# Patient Record
Sex: Female | Born: 1945
Health system: Southern US, Community
[De-identification: ages and names within clinical notes are randomized; demographics above are authoritative.]

## PROBLEM LIST (undated history)

## (undated) DIAGNOSIS — M1990 Unspecified osteoarthritis, unspecified site: Secondary | ICD-10-CM

## (undated) DIAGNOSIS — K219 Gastro-esophageal reflux disease without esophagitis: Secondary | ICD-10-CM

## (undated) DIAGNOSIS — C801 Malignant (primary) neoplasm, unspecified: Secondary | ICD-10-CM

## (undated) DIAGNOSIS — E785 Hyperlipidemia, unspecified: Secondary | ICD-10-CM

## (undated) DIAGNOSIS — K5792 Diverticulitis of intestine, part unspecified, without perforation or abscess without bleeding: Secondary | ICD-10-CM

## (undated) DIAGNOSIS — M8580 Other specified disorders of bone density and structure, unspecified site: Secondary | ICD-10-CM

## (undated) DIAGNOSIS — D649 Anemia, unspecified: Secondary | ICD-10-CM

## (undated) DIAGNOSIS — H9190 Unspecified hearing loss, unspecified ear: Secondary | ICD-10-CM

## (undated) DIAGNOSIS — IMO0001 Reserved for inherently not codable concepts without codable children: Secondary | ICD-10-CM

## (undated) HISTORY — PX: HEMORROIDECTOMY: SUR656

## (undated) HISTORY — DX: Hyperlipidemia, unspecified: E78.5

## (undated) HISTORY — PX: ABDOMINAL HYSTERECTOMY: SHX81

## (undated) HISTORY — PX: OTHER SURGICAL HISTORY: SHX169

## (undated) HISTORY — DX: Gastro-esophageal reflux disease without esophagitis: K21.9

---

## 1974-09-08 HISTORY — PX: MYOMECTOMY: SHX85

## 2006-04-02 ENCOUNTER — Ambulatory Visit: Payer: Self-pay | Admitting: Cardiology

## 2006-04-21 ENCOUNTER — Ambulatory Visit: Payer: Self-pay | Admitting: Cardiology

## 2014-04-02 ENCOUNTER — Emergency Department (HOSPITAL_COMMUNITY): Payer: Medicare FFS

## 2014-04-02 ENCOUNTER — Emergency Department (HOSPITAL_COMMUNITY)
Admission: EM | Admit: 2014-04-02 | Discharge: 2014-04-03 | Disposition: A | Discharge status: Home / Self Care | Payer: Medicare FFS | Attending: Emergency Medicine | Admitting: Emergency Medicine

## 2014-04-02 ENCOUNTER — Encounter (HOSPITAL_COMMUNITY): Payer: Self-pay | Admitting: Emergency Medicine

## 2014-04-02 DIAGNOSIS — Z79899 Other long term (current) drug therapy: Secondary | ICD-10-CM | POA: Insufficient documentation

## 2014-04-02 DIAGNOSIS — R109 Unspecified abdominal pain: Secondary | ICD-10-CM | POA: Diagnosis present

## 2014-04-02 DIAGNOSIS — K219 Gastro-esophageal reflux disease without esophagitis: Secondary | ICD-10-CM | POA: Insufficient documentation

## 2014-04-02 DIAGNOSIS — R112 Nausea with vomiting, unspecified: Secondary | ICD-10-CM | POA: Diagnosis present

## 2014-04-02 HISTORY — DX: Diverticulitis of intestine, part unspecified, without perforation or abscess without bleeding: K57.92

## 2014-04-02 HISTORY — DX: Gastro-esophageal reflux disease without esophagitis: K21.9

## 2014-04-02 LAB — COMPREHENSIVE METABOLIC PANEL
ALBUMIN: 3.5 g/dL (ref 3.5–5.2)
ALK PHOS: 71 U/L (ref 39–117)
ALT: 21 U/L (ref 0–35)
ANION GAP: 12 (ref 5–15)
AST: 15 U/L (ref 0–37)
BUN: 22 mg/dL (ref 6–23)
CO2: 24 mEq/L (ref 19–32)
Calcium: 8.9 mg/dL (ref 8.4–10.5)
Chloride: 103 mEq/L (ref 96–112)
Creatinine, Ser: 0.63 mg/dL (ref 0.50–1.10)
GFR calc Af Amer: >90 mL/min (ref >90)
GFR calc non Af Amer: 89 mL/min — ABNORMAL LOW (ref >90)
Glucose, Bld: 142 mg/dL — ABNORMAL HIGH (ref 70–99)
Potassium: 3.7 mEq/L (ref 3.7–5.3)
Sodium: 139 mEq/L (ref 137–147)
TOTAL PROTEIN: 6.5 g/dL (ref 6.0–8.3)
Total Bilirubin: 0.2 mg/dL — ABNORMAL LOW (ref 0.3–1.2)

## 2014-04-02 LAB — URINALYSIS, ROUTINE W REFLEX MICROSCOPIC
BILIRUBIN URINE: NEGATIVE
Glucose, UA: NEGATIVE mg/dL
Hgb urine dipstick: NEGATIVE
Ketones, ur: NEGATIVE mg/dL
Leukocytes, UA: NEGATIVE
NITRITE: NEGATIVE
PH: 6 (ref 5.0–8.0)
PROTEIN: NEGATIVE mg/dL
Specific Gravity, Urine: 1.02 (ref 1.005–1.030)
Urobilinogen, UA: 0.2 mg/dL (ref 0.0–1.0)

## 2014-04-02 LAB — CBC WITH DIFFERENTIAL/PLATELET
BASOS PCT: 0 % (ref 0–1)
Basophils Absolute: 0 10*3/uL (ref 0.0–0.1)
EOS ABS: 0.1 10*3/uL (ref 0.0–0.7)
Eosinophils Relative: 1 % (ref 0–5)
HCT: 35.6 % — ABNORMAL LOW (ref 36.0–46.0)
Hemoglobin: 12 g/dL (ref 12.0–15.0)
Lymphocytes Relative: 6 % — ABNORMAL LOW (ref 12–46)
Lymphs Abs: 0.7 10*3/uL (ref 0.7–4.0)
MCH: 27.5 pg (ref 26.0–34.0)
MCHC: 33.7 g/dL (ref 30.0–36.0)
MCV: 81.5 fL (ref 78.0–100.0)
Monocytes Absolute: 0.5 10*3/uL (ref 0.1–1.0)
Monocytes Relative: 4 % (ref 3–12)
Neutro Abs: 10.1 10*3/uL — ABNORMAL HIGH (ref 1.7–7.7)
Neutrophils Relative %: 89 % — ABNORMAL HIGH (ref 43–77)
PLATELETS: 330 10*3/uL (ref 150–400)
RBC: 4.37 MIL/uL (ref 3.87–5.11)
RDW: 14.4 % (ref 11.5–15.5)
WBC: 11.5 10*3/uL — ABNORMAL HIGH (ref 4.0–10.5)

## 2014-04-02 LAB — LACTIC ACID, PLASMA: Lactic Acid, Venous: 2.7 mmol/L — ABNORMAL HIGH (ref 0.5–2.2)

## 2014-04-02 LAB — LIPASE, BLOOD: LIPASE: 27 U/L (ref 11–59)

## 2014-04-02 MED ORDER — IOHEXOL 300 MG/ML  SOLN
100.0000 mL | Freq: Once | INTRAMUSCULAR | Status: AC | PRN
  Administered 2014-04-02: 100 mL via INTRAVENOUS

## 2014-04-02 MED ORDER — ONDANSETRON HCL 4 MG/2ML IJ SOLN
4.0000 mg | INTRAMUSCULAR | Status: DC | PRN
Start: 2014-04-02 — End: 2014-04-03
  Administered 2014-04-02: 4 mg via INTRAVENOUS
  Filled 2014-04-02: qty 2

## 2014-04-02 MED ORDER — IOHEXOL 300 MG/ML  SOLN
50.0000 mL | Freq: Once | INTRAMUSCULAR | Status: AC | PRN
  Administered 2014-04-02: 50 mL via ORAL

## 2014-04-02 MED ORDER — MORPHINE SULFATE 4 MG/ML IJ SOLN
4.0000 mg | INTRAMUSCULAR | Status: DC | PRN
  Administered 2014-04-02: 4 mg via INTRAVENOUS
  Filled 2014-04-02: qty 1

## 2014-04-02 MED ORDER — SODIUM CHLORIDE 0.9 % IV SOLN
INTRAVENOUS | Status: DC
  Administered 2014-04-02: 1000 mL via INTRAVENOUS

## 2014-04-02 NOTE — ED Notes (Signed)
Patient via [facility] c/o abdominal pain X6 Hours. Patient states nausea and vomiting X2. Patient also states she feels like she is constipated, patient states she has a loose bowel movement at 2300 yesterday. A&OX4. Patient c/o pain to the RUQ RLQ

## 2014-04-02 NOTE — ED Notes (Signed)
Patient ambulatory to restroom at this time. Steady gait no distress noted.

## 2014-04-02 NOTE — ED Notes (Signed)
Pt vomited while drinking contrast. Pt gown & linens changed. Pt instructed to take only sips to avoid vomiting.

## 2014-04-02 NOTE — ED Provider Notes (Signed)
CSN: [id number]     Arrival date & time 04/02/14  1930 History   First MD Initiated Contact with Patient 04/02/14 1937     Chief Complaint  Patient presents with  . Abdominal Pain     HPI Pt was seen at [place].  Per pt, c/o gradual onset and persistence of constant generalized abd "pain" since noon today.  Has been associated with multiple intermittent episodes of N/V.  Describes the abd pain as "dull" and "aching."  Last BM yesterday was "normal" per pt. Denies diarrhea, no fevers, no back pain, no rash, no CP/SOB, no black or blood in stools or emesis.       Past Medical History  Diagnosis Date  . Diverticulitis   . Acid reflux disease    Past Surgical History  Procedure Laterality Date  . Abdominal hysterectomy      History  Substance Use Topics  . Smoking status: Never Smoker   . Smokeless tobacco: Not on file  . Alcohol Use: 0.6 oz/week    1 Glasses of wine per week    Review of Systems ROS: Statement: All systems negative except as marked or noted in the HPI; Constitutional: Negative for fever and chills. ; ; Eyes: Negative for eye pain, redness and discharge. ; ; ENMT: Negative for ear pain, hoarseness, nasal congestion, sinus pressure and sore throat. ; ; Cardiovascular: Negative for chest pain, palpitations, diaphoresis, dyspnea and peripheral edema. ; ; Respiratory: Negative for cough, wheezing and stridor. ; ; Gastrointestinal: +abd pain, N/V. Negative for diarrhea, blood in stool, hematemesis, jaundice and rectal bleeding. . ; ; Genitourinary: Negative for dysuria, flank pain and hematuria. ; ; Musculoskeletal: Negative for back pain and neck pain. Negative for swelling and trauma.; ; Skin: Negative for pruritus, rash, abrasions, blisters, bruising and skin lesion.; ; Neuro: Negative for headache, lightheadedness and neck stiffness. Negative for weakness, altered level of consciousness , altered mental status, extremity weakness, paresthesias, involuntary movement, seizure  and syncope.      Allergies  Codeine  Home Medications   Prior to Admission medications   Medication Sig Start Date End Date Taking? Authorizing Provider  Acetaminophen-Caffeine (EXCEDRIN TENSION HEADACHE) 500-65 MG TABS Take 1-2 tablets by mouth daily as needed (for pain).   Yes Historical Provider, MD  Lactobacillus (ACIDOPHILUS PO) Take 2-3 capsules by mouth 2 (two) times daily. Three capsules in the morning and two capsules at bedtime   Yes Historical Provider, MD  Multiple Vitamin (MULTIVITAMIN WITH MINERALS) TABS tablet Take 1 tablet by mouth daily.   Yes Historical Provider, MD  omeprazole (PRILOSEC OTC) 20 MG tablet Take 20 mg by mouth daily.   Yes Historical Provider, MD   BP 155/83  Pulse 116  Temp(Src) 99.6 F (37.6 C) (Oral)  Resp 18  Ht 5\' 9"  (1.753 m)  Wt 165 lb (74.844 kg)  BMI 24.36 kg/m2  SpO2 95% Physical Exam 1950: Physical examination:  Nursing notes reviewed; Vital signs and O2 SAT reviewed;  Constitutional: Well developed, Well nourished, Well hydrated, Uncomfortable appearing.;; Head:  Normocephalic, atraumatic; Eyes: EOMI, PERRL, No scleral icterus; ENMT: Mouth and pharynx normal, Mucous membranes moist; Neck: Supple, Full range of motion, No lymphadenopathy; Cardiovascular: Regular rate and rhythm, No gallop; Respiratory: Breath sounds clear & equal bilaterally, No wheezes.  Speaking full sentences with ease, Normal respiratory effort/excursion; Chest: Nontender, Movement normal; Abdomen: Soft, +diffuse tenderness to palp, esp LLQ. No rebound or guarding. Nondistended, Normal bowel sounds; Genitourinary: No CVA tenderness; Extremities: Pulses normal, No  tenderness, No edema, No calf edema or asymmetry.; Neuro: AA&Ox3, Major CN grossly intact.  Speech clear. No gross focal motor or sensory deficits in extremities.; Skin: Color normal, Warm, Dry.   ED Course  Procedures     MDM  MDM Reviewed: previous chart, nursing note and vitals Interpretation:  labs   Results for orders placed during the hospital encounter of 04/02/14  URINALYSIS, ROUTINE W REFLEX MICROSCOPIC      Result Value Ref Range   Color, Urine YELLOW  YELLOW   APPearance CLEAR  CLEAR   Specific Gravity, Urine 1.020  1.005 - 1.030   pH 6.0  5.0 - 8.0   Glucose, UA NEGATIVE  NEGATIVE mg/dL   Hgb urine dipstick NEGATIVE  NEGATIVE   Bilirubin Urine NEGATIVE  NEGATIVE   Ketones, ur NEGATIVE  NEGATIVE mg/dL   Protein, ur NEGATIVE  NEGATIVE mg/dL   Urobilinogen, UA 0.2  0.0 - 1.0 mg/dL   Nitrite NEGATIVE  NEGATIVE   Leukocytes, UA NEGATIVE  NEGATIVE  CBC WITH DIFFERENTIAL      Result Value Ref Range   WBC 11.5 (*) 4.0 - 10.5 K/uL   RBC 4.37  3.87 - 5.11 MIL/uL   Hemoglobin 12.0  12.0 - 15.0 g/dL   HCT 35.6 (*) 36.0 - 46.0 %   MCV 81.5  78.0 - 100.0 fL   MCH 27.5  26.0 - 34.0 pg   MCHC 33.7  30.0 - 36.0 g/dL   RDW 14.4  11.5 - 15.5 %   Platelets 330  150 - 400 K/uL   Neutrophils Relative % 89 (*) 43 - 77 %   Neutro Abs 10.1 (*) 1.7 - 7.7 K/uL   Lymphocytes Relative 6 (*) 12 - 46 %   Lymphs Abs 0.7  0.7 - 4.0 K/uL   Monocytes Relative 4  3 - 12 %   Monocytes Absolute 0.5  0.1 - 1.0 K/uL   Eosinophils Relative 1  0 - 5 %   Eosinophils Absolute 0.1  0.0 - 0.7 K/uL   Basophils Relative 0  0 - 1 %   Basophils Absolute 0.0  0.0 - 0.1 K/uL  COMPREHENSIVE METABOLIC PANEL      Result Value Ref Range   Sodium 139  137 - 147 mEq/L   Potassium 3.7  3.7 - 5.3 mEq/L   Chloride 103  96 - 112 mEq/L   CO2 24  19 - 32 mEq/L   Glucose, Bld 142 (*) 70 - 99 mg/dL   BUN 22  6 - 23 mg/dL   Creatinine, Ser 0.63  0.50 - 1.10 mg/dL   Calcium 8.9  8.4 - 10.5 mg/dL   Total Protein 6.5  6.0 - 8.3 g/dL   Albumin 3.5  3.5 - 5.2 g/dL   AST 15  0 - 37 U/L   ALT 21  0 - 35 U/L   Alkaline Phosphatase 71  39 - 117 U/L   Total Bilirubin 0.2 (*) 0.3 - 1.2 mg/dL   GFR calc non Af Amer 89 (*) >90 mL/min   GFR calc Af Amer >90  >90 mL/min   Anion gap 12  5 - 15  LIPASE, BLOOD      Result  Value Ref Range   Lipase 27  11 - 59 U/L  LACTIC ACID, PLASMA      Result Value Ref Range   Lactic Acid, Venous 2.7 (*) 0.5 - 2.2 mmol/L     2215:  Pt has vomited while in  the ED. IV zofran given. CT scan pending. Dispo based on results. Sign out to Dr. Lacinda Axon.    Alfonzo Feller, DO 04/02/14 2221

## 2014-04-03 DIAGNOSIS — R112 Nausea with vomiting, unspecified: Secondary | ICD-10-CM | POA: Diagnosis present

## 2014-04-03 MED ORDER — ONDANSETRON HCL 8 MG PO TABS: 8.0000 mg | ORAL_TABLET | ORAL | Status: DC | PRN

## 2014-04-03 NOTE — ED Notes (Signed)
Patient verbalizes understanding of discharge instructions, medications, and follow up care. Patient ambulatory out of department at this time.

## 2014-04-03 NOTE — Discharge Instructions (Signed)
CT scan showed no acute findings. Medication for nausea. Followup your primary care Dr.

## 2014-04-03 NOTE — ED Provider Notes (Signed)
CT scan abdomen and pelvis showed no acute findings. No acute abdomen at discharge. Patient feels better. Discharge medications Zofran 8 mg  Nat Christen, MD 04/03/14 0010

## 2014-04-04 LAB — URINE CULTURE

## 2015-01-23 NOTE — Patient Instructions (Addendum)
Your procedure is scheduled on: 02/01/2015  Report to [facility] at 1220  PM.  Call this number if you have problems the morning of surgery: [phone]   Do not eat food or drink liquids :After Midnight.      Take these medicines the morning of surgery with A SIP OF WATER: prilosec, zofran   Do not wear jewelry, make-up or nail polish.  Do not wear lotions, powders, or perfumes.   Do not shave 48 hours prior to surgery.  Do not bring valuables to the hospital.  Contacts, dentures or bridgework may not be worn into surgery.  Leave suitcase in the car. After surgery it may be brought to your room.  For patients admitted to the hospital, checkout time is 11:00 AM the day of discharge.   Patients discharged the day of surgery will not be allowed to drive home.  :     Please read over the following fact sheets that you were given: Coughing and Deep Breathing, Surgical Site Infection Prevention, Anesthesia Post-op Instructions and Care and Recovery After Surgery    Cataract A cataract is a clouding of the lens of the eye. When a lens becomes cloudy, vision is reduced based on the degree and nature of the clouding. Many cataracts reduce vision to some degree. Some cataracts make people more near-sighted as they develop. Other cataracts increase glare. Cataracts that are ignored and become worse can sometimes look white. The white color can be seen through the pupil. CAUSES   Aging. However, cataracts may occur at any age, even in newborns.   Certain drugs.   Trauma to the eye.   Certain diseases such as diabetes.   Specific eye diseases such as chronic inflammation inside the eye or a sudden attack of a rare form of glaucoma.   Inherited or acquired medical problems.  SYMPTOMS   Gradual, progressive drop in vision in the affected eye.   Severe, rapid visual loss. This most often happens when trauma is the cause.  DIAGNOSIS  To detect a cataract, an eye doctor examines the lens.  Cataracts are best diagnosed with an exam of the eyes with the pupils enlarged (dilated) by drops.  TREATMENT  For an early cataract, vision may improve by using different eyeglasses or stronger lighting. If that does not help your vision, surgery is the only effective treatment. A cataract needs to be surgically removed when vision loss interferes with your everyday activities, such as driving, reading, or watching TV. A cataract may also have to be removed if it prevents examination or treatment of another eye problem. Surgery removes the cloudy lens and usually replaces it with a substitute lens (intraocular lens, IOL).  At a time when both you and your doctor agree, the cataract will be surgically removed. If you have cataracts in both eyes, only one is usually removed at a time. This allows the operated eye to heal and be out of danger from any possible problems after surgery (such as infection or poor wound healing). In rare cases, a cataract may be doing damage to your eye. In these cases, your caregiver may advise surgical removal right away. The vast majority of people who have cataract surgery have better vision afterward. HOME CARE INSTRUCTIONS  If you are not planning surgery, you may be asked to do the following:  Use different eyeglasses.   Use stronger or brighter lighting.   Ask your eye doctor about reducing your medicine dose or changing medicines if  it is thought that a medicine caused your cataract. Changing medicines does not make the cataract go away on its own.   Become familiar with your surroundings. Poor vision can lead to injury. Avoid bumping into things on the affected side. You are at a higher risk for tripping or falling.   Exercise extreme care when driving or operating machinery.   Wear sunglasses if you are sensitive to bright light or experiencing problems with glare.  SEEK IMMEDIATE MEDICAL CARE IF:   You have a worsening or sudden vision loss.   You notice  redness, swelling, or increasing pain in the eye.   You have a fever.  Document Released: 08/25/2005 Document Revised: 08/14/2011 Document Reviewed: 04/18/2011 [facility] Patient Information 2012 [place].PATIENT INSTRUCTIONS POST-ANESTHESIA  IMMEDIATELY FOLLOWING SURGERY:  Do not drive or operate machinery for the first twenty four hours after surgery.  Do not make any important decisions for twenty four hours after surgery or while taking narcotic pain medications or sedatives.  If you develop intractable nausea and vomiting or a severe headache please notify your doctor immediately.  FOLLOW-UP:  Please make an appointment with your surgeon as instructed. You do not need to follow up with anesthesia unless specifically instructed to do so.  WOUND CARE INSTRUCTIONS (if applicable):  Keep a dry clean dressing on the anesthesia/puncture wound site if there is drainage.  Once the wound has quit draining you may leave it open to air.  Generally you should leave the bandage intact for twenty four hours unless there is drainage.  If the epidural site drains for more than 36-48 hours please call the anesthesia department.  QUESTIONS?:  Please feel free to call your physician or the hospital operator if you have any questions, and they will be happy to assist you.

## 2015-01-24 ENCOUNTER — Encounter (HOSPITAL_COMMUNITY): Payer: Self-pay

## 2015-01-24 ENCOUNTER — Other Ambulatory Visit: Payer: Self-pay

## 2015-01-24 ENCOUNTER — Encounter (HOSPITAL_COMMUNITY)
Admission: RE | Admit: 2015-01-24 | Discharge: 2015-01-24 | Disposition: A | Discharge status: Home / Self Care | Payer: Medicare FFS | Source: Ambulatory Visit | Attending: Ophthalmology | Admitting: Ophthalmology

## 2015-01-24 DIAGNOSIS — H269 Unspecified cataract: Secondary | ICD-10-CM | POA: Insufficient documentation

## 2015-01-24 DIAGNOSIS — Z01810 Encounter for preprocedural cardiovascular examination: Secondary | ICD-10-CM | POA: Insufficient documentation

## 2015-01-24 DIAGNOSIS — Z01812 Encounter for preprocedural laboratory examination: Secondary | ICD-10-CM | POA: Insufficient documentation

## 2015-01-24 HISTORY — DX: Reserved for inherently not codable concepts without codable children: IMO0001

## 2015-01-24 HISTORY — DX: Unspecified osteoarthritis, unspecified site: M19.90

## 2015-01-24 HISTORY — DX: Unspecified hearing loss, unspecified ear: H91.90

## 2015-01-24 HISTORY — DX: Anemia, unspecified: D64.9

## 2015-01-24 LAB — CBC
HCT: 39.7 % (ref 36.0–46.0)
HEMOGLOBIN: 13.4 g/dL (ref 12.0–15.0)
MCH: 29.6 pg (ref 26.0–34.0)
MCHC: 33.8 g/dL (ref 30.0–36.0)
MCV: 87.8 fL (ref 78.0–100.0)
Platelets: 270 10*3/uL (ref 150–400)
RBC: 4.52 MIL/uL (ref 3.87–5.11)
RDW: 14.2 % (ref 11.5–15.5)
WBC: 7.2 10*3/uL (ref 4.0–10.5)

## 2015-01-24 LAB — BASIC METABOLIC PANEL
Anion gap: 8 (ref 5–15)
BUN: 18 mg/dL (ref 6–20)
CO2: 27 mmol/L (ref 22–32)
Calcium: 9.5 mg/dL (ref 8.9–10.3)
Chloride: 103 mmol/L (ref 101–111)
Creatinine, Ser: 0.57 mg/dL (ref 0.44–1.00)
GFR calc Af Amer: >60 mL/min (ref >60)
GLUCOSE: 101 mg/dL — AB (ref 65–99)
Potassium: 3.6 mmol/L (ref 3.5–5.1)
SODIUM: 138 mmol/L (ref 135–145)

## 2015-01-24 NOTE — Pre-Procedure Instructions (Signed)
Patient given information to sign up for my chart at home. 

## 2015-02-01 ENCOUNTER — Ambulatory Visit (HOSPITAL_COMMUNITY): Payer: Medicare FFS | Admitting: Anesthesiology

## 2015-02-01 ENCOUNTER — Ambulatory Visit (HOSPITAL_COMMUNITY)
Admission: RE | Admit: 2015-02-01 | Discharge: 2015-02-01 | Disposition: A | Discharge status: Home / Self Care | Payer: Medicare FFS | Source: Ambulatory Visit | Attending: Ophthalmology | Admitting: Ophthalmology

## 2015-02-01 ENCOUNTER — Encounter (HOSPITAL_COMMUNITY)
Admission: RE | Disposition: A | Discharge status: Home / Self Care | Payer: Self-pay | Source: Ambulatory Visit | Attending: Ophthalmology

## 2015-02-01 ENCOUNTER — Encounter (HOSPITAL_COMMUNITY): Payer: Self-pay | Admitting: Ophthalmology

## 2015-02-01 DIAGNOSIS — H25812 Combined forms of age-related cataract, left eye: Secondary | ICD-10-CM | POA: Insufficient documentation

## 2015-02-01 DIAGNOSIS — H269 Unspecified cataract: Secondary | ICD-10-CM | POA: Diagnosis present

## 2015-02-01 DIAGNOSIS — K219 Gastro-esophageal reflux disease without esophagitis: Secondary | ICD-10-CM | POA: Diagnosis not present

## 2015-02-01 DIAGNOSIS — H52202 Unspecified astigmatism, left eye: Secondary | ICD-10-CM | POA: Insufficient documentation

## 2015-02-01 HISTORY — PX: CATARACT EXTRACTION W/PHACO: SHX586

## 2015-02-01 SURGERY — PHACOEMULSIFICATION, CATARACT, WITH IOL INSERTION
Anesthesia: Monitor Anesthesia Care | Site: Eye | Laterality: Left

## 2015-02-01 MED ORDER — TETRACAINE HCL 0.5 % OP SOLN
1.0000 [drp] | OPHTHALMIC | Status: AC
  Administered 2015-02-01 (×3): 1 [drp] via OPHTHALMIC

## 2015-02-01 MED ORDER — FENTANYL CITRATE (PF) 100 MCG/2ML IJ SOLN
INTRAMUSCULAR | Status: AC
  Filled 2015-02-01: qty 2

## 2015-02-01 MED ORDER — PHENYLEPHRINE HCL 2.5 % OP SOLN
1.0000 [drp] | OPHTHALMIC | Status: AC | PRN
  Administered 2015-02-01 (×3): 1 [drp] via OPHTHALMIC

## 2015-02-01 MED ORDER — LIDOCAINE HCL 3.5 % OP GEL
1.0000 "application " | Freq: Once | OPHTHALMIC | Status: AC
  Administered 2015-02-01: 1 via OPHTHALMIC

## 2015-02-01 MED ORDER — LIDOCAINE HCL (PF) 1 % IJ SOLN
INTRAMUSCULAR | Status: DC | PRN
  Administered 2015-02-01: .7 mL

## 2015-02-01 MED ORDER — MIDAZOLAM HCL 2 MG/2ML IJ SOLN
1.0000 mg | INTRAMUSCULAR | Status: DC | PRN
  Administered 2015-02-01: 2 mg via INTRAVENOUS

## 2015-02-01 MED ORDER — BSS IO SOLN
INTRAOCULAR | Status: DC | PRN
  Administered 2015-02-01: 15 mL

## 2015-02-01 MED ORDER — LACTATED RINGERS IV SOLN
INTRAVENOUS | Status: DC
  Administered 2015-02-01: 14:00:00 via INTRAVENOUS

## 2015-02-01 MED ORDER — POVIDONE-IODINE 5 % OP SOLN
OPHTHALMIC | Status: DC | PRN
  Administered 2015-02-01: 1 via OPHTHALMIC

## 2015-02-01 MED ORDER — MIDAZOLAM HCL 2 MG/2ML IJ SOLN
INTRAMUSCULAR | Status: AC
  Filled 2015-02-01: qty 2

## 2015-02-01 MED ORDER — NEOMYCIN-POLYMYXIN-DEXAMETH 3.5-10000-0.1 OP SUSP
OPHTHALMIC | Status: DC | PRN
  Administered 2015-02-01: 2 [drp] via OPHTHALMIC

## 2015-02-01 MED ORDER — EPINEPHRINE HCL 1 MG/ML IJ SOLN
INTRAMUSCULAR | Status: AC
  Filled 2015-02-01: qty 1

## 2015-02-01 MED ORDER — FENTANYL CITRATE (PF) 100 MCG/2ML IJ SOLN
25.0000 ug | INTRAMUSCULAR | Status: AC
  Administered 2015-02-01 (×2): 25 ug via INTRAVENOUS

## 2015-02-01 MED ORDER — CYCLOPENTOLATE-PHENYLEPHRINE 0.2-1 % OP SOLN
1.0000 [drp] | OPHTHALMIC | Status: AC
  Administered 2015-02-01 (×3): 1 [drp] via OPHTHALMIC

## 2015-02-01 MED ORDER — EPINEPHRINE HCL 1 MG/ML IJ SOLN
INTRAOCULAR | Status: DC | PRN
  Administered 2015-02-01: 500 mL

## 2015-02-01 MED ORDER — PROVISC 10 MG/ML IO SOLN
INTRAOCULAR | Status: DC | PRN
  Administered 2015-02-01: 0.85 mL via INTRAOCULAR

## 2015-02-01 SURGICAL SUPPLY — 33 items
CAPSULAR TENSION RING-AMO (OPHTHALMIC RELATED) IMPLANT
CLOTH BEACON ORANGE TIMEOUT ST (SAFETY) ×1 IMPLANT
EYE SHIELD UNIVERSAL CLEAR (GAUZE/BANDAGES/DRESSINGS) ×1 IMPLANT
GLOVE BIO SURGEON STRL SZ 6.5 (GLOVE) IMPLANT
GLOVE BIOGEL PI IND STRL 6.5 (GLOVE) IMPLANT
GLOVE BIOGEL PI IND STRL 7.0 (GLOVE) IMPLANT
GLOVE BIOGEL PI IND STRL 7.5 (GLOVE) IMPLANT
GLOVE BIOGEL PI INDICATOR 6.5 (GLOVE)
GLOVE BIOGEL PI INDICATOR 7.0 (GLOVE) ×2
GLOVE BIOGEL PI INDICATOR 7.5 (GLOVE)
GLOVE ECLIPSE 6.5 STRL STRAW (GLOVE) IMPLANT
GLOVE ECLIPSE 7.0 STRL STRAW (GLOVE) IMPLANT
GLOVE ECLIPSE 7.5 STRL STRAW (GLOVE) IMPLANT
GLOVE EXAM NITRILE LRG STRL (GLOVE) IMPLANT
GLOVE EXAM NITRILE MD LF STRL (GLOVE) IMPLANT
GLOVE SKINSENSE NS SZ6.5 (GLOVE)
GLOVE SKINSENSE NS SZ7.0 (GLOVE)
GLOVE SKINSENSE STRL SZ6.5 (GLOVE) IMPLANT
GLOVE SKINSENSE STRL SZ7.0 (GLOVE) IMPLANT
KIT VITRECTOMY (OPHTHALMIC RELATED) IMPLANT
LENS STAAR TORIC (Intraocular Lens) ×1 IMPLANT
PAD ARMBOARD 7.5X6 YLW CONV (MISCELLANEOUS) ×1 IMPLANT
PROC W NO LENS (INTRAOCULAR LENS)
PROC W SPEC LENS (INTRAOCULAR LENS) ×2
PROCESS W NO LENS (INTRAOCULAR LENS) IMPLANT
PROCESS W SPEC LENS (INTRAOCULAR LENS) IMPLANT
RETRACTOR IRIS SIGHTPATH (OPHTHALMIC RELATED) IMPLANT
RING MALYGIN (MISCELLANEOUS) IMPLANT
SYRINGE LUER LOK 1CC (MISCELLANEOUS) ×1 IMPLANT
TAPE SURG TRANSPORE 1 IN (GAUZE/BANDAGES/DRESSINGS) IMPLANT
TAPE SURGICAL TRANSPORE 1 IN (GAUZE/BANDAGES/DRESSINGS) ×1
VISCOELASTIC ADDITIONAL (OPHTHALMIC RELATED) IMPLANT
WATER STERILE IRR 250ML POUR (IV SOLUTION) ×1 IMPLANT

## 2015-02-01 NOTE — Anesthesia Procedure Notes (Signed)
Procedure Name: MAC Date/Time: 02/01/2015 2:18 PM Performed by: Andree Elk, Caelen Higinbotham A Pre-anesthesia Checklist: Patient identified, Timeout performed, Emergency Drugs available, Suction available and Patient being monitored Oxygen Delivery Method: Nasal cannula Intubation Type: Rapid sequence

## 2015-02-01 NOTE — Discharge Instructions (Signed)

## 2015-02-01 NOTE — Transfer of Care (Signed)
Immediate Anesthesia Transfer of Care Note  Patient: Carrie Mayer  Procedure(s) Performed: Procedure(s) with comments: CATARACT EXTRACTION PHACO AND INTRAOCULAR LENS PLACEMENT (IOC) (Left) - CDE 6.48  Patient Location: Short Stay  Anesthesia Type:MAC  Level of Consciousness: awake, alert , oriented and patient cooperative  Airway & Oxygen Therapy: Patient Spontanous Breathing  Post-op Assessment: Report given to RN and Post -op Vital signs reviewed and stable  Post vital signs: Reviewed and stable  Last Vitals:  Filed Vitals:   02/01/15 1400  BP: 132/76  Pulse:   Temp:   Resp: 23    Complications: No apparent anesthesia complications

## 2015-02-01 NOTE — Op Note (Signed)
Date of Admission: 02/01/2015  Date of Surgery: 02/01/2015  Pre-Op Dx: Cataract Left Eye  Post-Op Dx: Senile Combined Cataract Left Eye,  Dx Code H25.812, Astigmatism Left Eye, Dx Code H52.2  Surgeon: Tonny Branch, M.D.  Assistants: None  Anesthesia: Topical with MAC  Indications: Painless, progressive loss of vision with compromise of daily activities.  Surgery: Cataract Extraction with Intraocular lens Implant Left Eye  Discription: The patient had dilating drops and viscous lidocaine placed into the Left in the pre-op holding area. In the sitting position horizontal reference marks were made on the cornea.  After transfer to the operating room, a time out was performed. The patient was then prepped and draped. Beginning with a 70 degree blade a paracentesis port was made at the surgeon's 2 o'clock position. The anterior chamber was then filled with 1% non-preserved lidocaine. This was followed by filling the anterior chamber with Provisc. A 2.63mm keratome blade was used to make a clear cornea incision at the temporal limbus. A bent cystatome needle was used to create a continuous tear capsulotomy. Hydrodissection was performed with balanced salt solution on a Fine canula. The lens nucleus was then removed using the phacoemulsification handpiece. Residual cortex was removed with the I&A handpiece. The anterior chamber and capsular bag were refilled with Provisc. A posterior chamber intraocular lens was placed into the capsular bag with it's injector. Additional corneal marks were made on the 100/280 degree meridians.  The Provisc was then removed from the anterior chamber and capsular bag with the I&A handpiece. The implant was positioned with the Kuglan hook. Stromal hydration of the main incision and paracentesis port was performed with BSS on a Fine canula. The wounds were tested for leak which was negative. The patient tolerated the procedure well. There were no operative complications. The  patient was then transferred to the recovery room in stable condition.  Complications: None  Specimen: None  EBL: None  Prosthetic device: STAAR Toric, [id number], power 13.0SE/+2.0, SN [id number].

## 2015-02-01 NOTE — Anesthesia Preprocedure Evaluation (Signed)
Anesthesia Evaluation  Patient identified by MRN, date of birth, ID band Patient awake    Reviewed: Allergy & Precautions, NPO status , Patient's Chart, lab work & pertinent test results  Airway Mallampati: II  TM Distance: >3 FB     Dental  (+) Teeth Intact   Pulmonary shortness of breath,  breath sounds clear to auscultation        Cardiovascular negative cardio ROS  Rhythm:Regular Rate:Normal     Neuro/Psych    GI/Hepatic GERD-  ,  Endo/Other    Renal/GU      Musculoskeletal   Abdominal   Peds  Hematology  (+) anemia ,   Anesthesia Other Findings   Reproductive/Obstetrics                             Anesthesia Physical Anesthesia Plan  ASA: II  Anesthesia Plan: MAC   Post-op Pain Management:    Induction: Intravenous  Airway Management Planned: Nasal Cannula  Additional Equipment:   Intra-op Plan:   Post-operative Plan:   Informed Consent: I have reviewed the patients History and Physical, chart, labs and discussed the procedure including the risks, benefits and alternatives for the proposed anesthesia with the patient or authorized representative who has indicated his/her understanding and acceptance.     Plan Discussed with:   Anesthesia Plan Comments:         Anesthesia Quick Evaluation

## 2015-02-01 NOTE — Anesthesia Postprocedure Evaluation (Signed)
  Anesthesia Post-op Note  Patient: Carrie Mayer  Procedure(s) Performed: Procedure(s) with comments: CATARACT EXTRACTION PHACO AND INTRAOCULAR LENS PLACEMENT (IOC) (Left) - CDE 6.48  Patient Location: Short Stay  Anesthesia Type:MAC  Level of Consciousness: awake, alert , oriented and patient cooperative  Airway and Oxygen Therapy: Patient Spontanous Breathing  Post-op Pain: none  Post-op Assessment: Post-op Vital signs reviewed, Patient's Cardiovascular Status Stable, Respiratory Function Stable, Patent Airway, No signs of Nausea or vomiting and Pain level controlled  Post-op Vital Signs: Reviewed and stable  Last Vitals:  Filed Vitals:   02/01/15 1400  BP: 132/76  Pulse:   Temp:   Resp: 23    Complications: No apparent anesthesia complications

## 2015-02-01 NOTE — H&P (Signed)
I have reviewed the H&P, the patient was re-examined, and I have identified no interval changes in medical condition and plan of care since the history and physical of record  

## 2015-02-02 ENCOUNTER — Encounter (HOSPITAL_COMMUNITY): Payer: Self-pay | Admitting: Ophthalmology

## 2015-02-26 ENCOUNTER — Encounter (HOSPITAL_COMMUNITY)
Admission: RE | Admit: 2015-02-26 | Discharge: 2015-02-26 | Disposition: A | Discharge status: Home / Self Care | Payer: Medicare HMO | Source: Ambulatory Visit | Attending: Ophthalmology | Admitting: Ophthalmology

## 2015-02-28 MED ORDER — TETRACAINE HCL 0.5 % OP SOLN
OPHTHALMIC | Status: AC
  Filled 2015-02-28: qty 2

## 2015-02-28 MED ORDER — CYCLOPENTOLATE-PHENYLEPHRINE OP SOLN OPTIME - NO CHARGE
OPHTHALMIC | Status: AC
  Filled 2015-02-28: qty 2

## 2015-02-28 MED ORDER — NEOMYCIN-POLYMYXIN-DEXAMETH 3.5-10000-0.1 OP SUSP
OPHTHALMIC | Status: AC
  Filled 2015-02-28: qty 5

## 2015-02-28 MED ORDER — PHENYLEPHRINE HCL 2.5 % OP SOLN
OPHTHALMIC | Status: AC
  Filled 2015-02-28: qty 15

## 2015-02-28 MED ORDER — LIDOCAINE HCL 3.5 % OP GEL
OPHTHALMIC | Status: AC
  Filled 2015-02-28: qty 1

## 2015-02-28 MED ORDER — LIDOCAINE HCL (PF) 1 % IJ SOLN
INTRAMUSCULAR | Status: AC
  Filled 2015-02-28: qty 2

## 2015-03-01 ENCOUNTER — Ambulatory Visit (HOSPITAL_COMMUNITY)
Admission: RE | Admit: 2015-03-01 | Discharge: 2015-03-01 | Disposition: A | Discharge status: Home / Self Care | Payer: Medicare HMO | Source: Ambulatory Visit | Attending: Ophthalmology | Admitting: Ophthalmology

## 2015-03-01 ENCOUNTER — Encounter (HOSPITAL_COMMUNITY)
Admission: RE | Disposition: A | Discharge status: Home / Self Care | Payer: Self-pay | Source: Ambulatory Visit | Attending: Ophthalmology

## 2015-03-01 ENCOUNTER — Ambulatory Visit (HOSPITAL_COMMUNITY): Payer: Medicare HMO | Admitting: Anesthesiology

## 2015-03-01 ENCOUNTER — Encounter (HOSPITAL_COMMUNITY): Payer: Self-pay | Admitting: *Deleted

## 2015-03-01 DIAGNOSIS — H52201 Unspecified astigmatism, right eye: Secondary | ICD-10-CM | POA: Diagnosis not present

## 2015-03-01 DIAGNOSIS — H25811 Combined forms of age-related cataract, right eye: Secondary | ICD-10-CM | POA: Diagnosis not present

## 2015-03-01 DIAGNOSIS — K219 Gastro-esophageal reflux disease without esophagitis: Secondary | ICD-10-CM | POA: Diagnosis not present

## 2015-03-01 DIAGNOSIS — M1990 Unspecified osteoarthritis, unspecified site: Secondary | ICD-10-CM | POA: Diagnosis not present

## 2015-03-01 HISTORY — PX: CATARACT EXTRACTION W/PHACO: SHX586

## 2015-03-01 SURGERY — PHACOEMULSIFICATION, CATARACT, WITH IOL INSERTION
Anesthesia: Monitor Anesthesia Care | Site: Eye | Laterality: Right

## 2015-03-01 MED ORDER — TETRACAINE HCL 0.5 % OP SOLN
1.0000 [drp] | OPHTHALMIC | Status: AC
  Administered 2015-03-01 (×3): 1 [drp] via OPHTHALMIC

## 2015-03-01 MED ORDER — LIDOCAINE HCL 3.5 % OP GEL: 1.0000 "application " | Freq: Once | OPHTHALMIC | Status: DC

## 2015-03-01 MED ORDER — FENTANYL CITRATE (PF) 100 MCG/2ML IJ SOLN
INTRAMUSCULAR | Status: AC
  Filled 2015-03-01: qty 2

## 2015-03-01 MED ORDER — NEOMYCIN-POLYMYXIN-DEXAMETH 3.5-10000-0.1 OP SUSP
OPHTHALMIC | Status: DC | PRN
Start: 2015-03-01 — End: 2015-03-01
  Administered 2015-03-01: 1 [drp] via OPHTHALMIC

## 2015-03-01 MED ORDER — LACTATED RINGERS IV SOLN
INTRAVENOUS | Status: DC
  Administered 2015-03-01: 1000 mL via INTRAVENOUS

## 2015-03-01 MED ORDER — ACETAZOLAMIDE 250 MG PO TABS
500.0000 mg | ORAL_TABLET | Freq: Two times a day (BID) | ORAL | Status: AC
  Administered 2015-03-01: 500 mg via ORAL

## 2015-03-01 MED ORDER — CYCLOPENTOLATE-PHENYLEPHRINE 0.2-1 % OP SOLN
1.0000 [drp] | OPHTHALMIC | Status: AC
  Administered 2015-03-01 (×3): 1 [drp] via OPHTHALMIC

## 2015-03-01 MED ORDER — MIDAZOLAM HCL 2 MG/2ML IJ SOLN
INTRAMUSCULAR | Status: AC
  Filled 2015-03-01: qty 2

## 2015-03-01 MED ORDER — EPINEPHRINE HCL 1 MG/ML IJ SOLN
INTRAMUSCULAR | Status: AC
  Filled 2015-03-01: qty 1

## 2015-03-01 MED ORDER — LIDOCAINE 3.5 % OP GEL OPTIME - NO CHARGE
OPHTHALMIC | Status: DC | PRN
  Administered 2015-03-01: 1 [drp] via OPHTHALMIC

## 2015-03-01 MED ORDER — LIDOCAINE HCL (PF) 1 % IJ SOLN
INTRAMUSCULAR | Status: DC | PRN
  Administered 2015-03-01: .5 mL

## 2015-03-01 MED ORDER — ACETAZOLAMIDE 250 MG PO TABS
ORAL_TABLET | ORAL | Status: AC
  Filled 2015-03-01: qty 2

## 2015-03-01 MED ORDER — BSS IO SOLN
INTRAOCULAR | Status: DC | PRN
  Administered 2015-03-01: 15 mL via INTRAOCULAR

## 2015-03-01 MED ORDER — MIDAZOLAM HCL 2 MG/2ML IJ SOLN
1.0000 mg | INTRAMUSCULAR | Status: DC | PRN
  Administered 2015-03-01: 2 mg via INTRAVENOUS

## 2015-03-01 MED ORDER — PROVISC 10 MG/ML IO SOLN
INTRAOCULAR | Status: DC | PRN
  Administered 2015-03-01: 0.85 mL via INTRAOCULAR

## 2015-03-01 MED ORDER — PHENYLEPHRINE HCL 2.5 % OP SOLN
1.0000 [drp] | OPHTHALMIC | Status: AC
  Administered 2015-03-01 (×3): 1 [drp] via OPHTHALMIC

## 2015-03-01 MED ORDER — POVIDONE-IODINE 5 % OP SOLN
OPHTHALMIC | Status: DC | PRN
  Administered 2015-03-01: 1 via OPHTHALMIC

## 2015-03-01 MED ORDER — EPINEPHRINE HCL 1 MG/ML IJ SOLN
INTRAMUSCULAR | Status: DC | PRN
  Administered 2015-03-01: 500 mL

## 2015-03-01 MED ORDER — FENTANYL CITRATE (PF) 100 MCG/2ML IJ SOLN
25.0000 ug | INTRAMUSCULAR | Status: AC
  Administered 2015-03-01 (×2): 25 ug via INTRAVENOUS

## 2015-03-01 SURGICAL SUPPLY — 33 items
CAPSULAR TENSION RING-AMO (OPHTHALMIC RELATED) IMPLANT
CLOTH BEACON ORANGE TIMEOUT ST (SAFETY) ×1 IMPLANT
EYE SHIELD UNIVERSAL CLEAR (GAUZE/BANDAGES/DRESSINGS) ×1 IMPLANT
GLOVE BIO SURGEON STRL SZ 6.5 (GLOVE) IMPLANT
GLOVE BIOGEL PI IND STRL 6.5 (GLOVE) IMPLANT
GLOVE BIOGEL PI IND STRL 7.0 (GLOVE) IMPLANT
GLOVE BIOGEL PI IND STRL 7.5 (GLOVE) IMPLANT
GLOVE BIOGEL PI INDICATOR 6.5 (GLOVE) ×1
GLOVE BIOGEL PI INDICATOR 7.0 (GLOVE)
GLOVE BIOGEL PI INDICATOR 7.5 (GLOVE)
GLOVE ECLIPSE 6.5 STRL STRAW (GLOVE) IMPLANT
GLOVE ECLIPSE 7.0 STRL STRAW (GLOVE) IMPLANT
GLOVE ECLIPSE 7.5 STRL STRAW (GLOVE) IMPLANT
GLOVE EXAM NITRILE LRG STRL (GLOVE) IMPLANT
GLOVE EXAM NITRILE MD LF STRL (GLOVE) ×1 IMPLANT
GLOVE SKINSENSE NS SZ6.5 (GLOVE)
GLOVE SKINSENSE NS SZ7.0 (GLOVE)
GLOVE SKINSENSE STRL SZ6.5 (GLOVE) IMPLANT
GLOVE SKINSENSE STRL SZ7.0 (GLOVE) IMPLANT
KIT VITRECTOMY (OPHTHALMIC RELATED) IMPLANT
LENS STAAR TORIC (Intraocular Lens) ×1 IMPLANT
PAD ARMBOARD 7.5X6 YLW CONV (MISCELLANEOUS) ×1 IMPLANT
PROC W NO LENS (INTRAOCULAR LENS)
PROC W SPEC LENS (INTRAOCULAR LENS) ×2
PROCESS W NO LENS (INTRAOCULAR LENS) IMPLANT
PROCESS W SPEC LENS (INTRAOCULAR LENS) IMPLANT
RETRACTOR IRIS SIGHTPATH (OPHTHALMIC RELATED) IMPLANT
RING MALYGIN (MISCELLANEOUS) IMPLANT
SYRINGE LUER LOK 1CC (MISCELLANEOUS) ×1 IMPLANT
TAPE SURG TRANSPORE 1 IN (GAUZE/BANDAGES/DRESSINGS) IMPLANT
TAPE SURGICAL TRANSPORE 1 IN (GAUZE/BANDAGES/DRESSINGS) ×1
VISCOELASTIC ADDITIONAL (OPHTHALMIC RELATED) IMPLANT
WATER STERILE IRR 250ML POUR (IV SOLUTION) ×1 IMPLANT

## 2015-03-01 NOTE — Anesthesia Postprocedure Evaluation (Signed)
  Anesthesia Post-op Note  Patient: Carrie Mayer  Procedure(s) Performed: Procedure(s) (LRB): CATARACT EXTRACTION PHACO AND INTRAOCULAR LENS PLACEMENT RIGHT EYE; CDE: 4.09 (Right)  Patient Location:  Short Stay  Anesthesia Type: MAC  Level of Consciousness: awake  Airway and Oxygen Therapy: Patient Spontanous Breathing  Post-op Pain: none  Post-op Assessment: Post-op Vital signs reviewed, Patient's Cardiovascular Status Stable, Respiratory Function Stable, Patent Airway, No signs of Nausea or vomiting and Pain level controlled  Post-op Vital Signs: Reviewed and stable  Complications: No apparent anesthesia complications

## 2015-03-01 NOTE — Op Note (Signed)
Date of Admission: 03/01/2015  Date of Surgery: 03/01/2015  Pre-Op Dx: Cataract Right Eye  Post-Op Dx: Senile Combined Cataract Right Eye,  Dx Code H25.811, Astigmatism Right Eye, Dx Code H52.2  Surgeon: Tonny Branch, M.D.  Assistants: None  Anesthesia: Topical with MAC  Indications: Painless, progressive loss of vision with compromise of daily activities.  Surgery: Cataract Extraction with Intraocular lens Implant Right Eye  Discription: The patient had dilating drops and viscous lidocaine placed into the Right in the pre-op holding area. In the sitting position horizontal reference marks were made on the cornea.  After transfer to the operating room, a time out was performed. The patient was then prepped and draped. Beginning with a 65 degree blade a paracentesis port was made at the surgeon's 2 o'clock position. The anterior chamber was then filled with 1% non-preserved lidocaine. This was followed by filling the anterior chamber with Provisc. A 2.57mm keratome blade was used to make a clear cornea incision at the temporal limbus. A bent cystatome needle was used to create a continuous tear capsulotomy. Hydrodissection was performed with balanced salt solution on a Fine canula. The lens nucleus was then removed using the phacoemulsification handpiece. Residual cortex was removed with the I&A handpiece. The anterior chamber and capsular bag were refilled with Provisc. A posterior chamber intraocular lens was placed into the capsular bag with it's injector. Additional corneal marks were made on the 90/270 degree meridians.  The Provisc was then removed from the anterior chamber and capsular bag with the I&A handpiece. The implant was positioned with the Kuglan hook. Stromal hydration of the main incision and paracentesis port was performed with BSS on a Fine canula. The wounds were tested for leak which was negative. The patient tolerated the procedure well. There were no operative complications. The  patient was then transferred to the recovery room in stable condition.  Complications: None  Specimen: None  EBL: None  Prosthetic device: STAAR Toric, [id number], power 14.0SE/+2.0, SN [id number].

## 2015-03-01 NOTE — Transfer of Care (Signed)
Immediate Anesthesia Transfer of Care Note  Patient: Carrie Mayer  Procedure(s) Performed: Procedure(s) (LRB): CATARACT EXTRACTION PHACO AND INTRAOCULAR LENS PLACEMENT RIGHT EYE; CDE: 4.09 (Right)  Patient Location: Shortstay  Anesthesia Type: MAC  Level of Consciousness: awake  Airway & Oxygen Therapy: Patient Spontanous Breathing   Post-op Assessment: Report given to PACU RN, Post -op Vital signs reviewed and stable and Patient moving all extremities  Post vital signs: Reviewed and stable  Complications: No apparent anesthesia complications

## 2015-03-01 NOTE — Discharge Instructions (Signed)

## 2015-03-01 NOTE — H&P (Signed)
I have reviewed the H&P, the patient was re-examined, and I have identified no interval changes in medical condition and plan of care since the history and physical of record  

## 2015-03-01 NOTE — Addendum Note (Signed)
Addendum  created 03/01/15 0806 by Vista Deck, CRNA   Modules edited: Anesthesia Blocks and Procedures, Clinical Notes   Clinical Notes:  File: [id number]

## 2015-03-01 NOTE — Anesthesia Procedure Notes (Signed)
Procedure Name: MAC Date/Time: 03/01/2015 7:31 AM Performed by: Vista Deck Pre-anesthesia Checklist: Patient identified, Emergency Drugs available, Suction available, Timeout performed and Patient being monitored Patient Re-evaluated:Patient Re-evaluated prior to inductionOxygen Delivery Method: Nasal Cannula

## 2015-03-01 NOTE — Anesthesia Preprocedure Evaluation (Signed)
Anesthesia Evaluation  Patient identified by MRN, date of birth, ID band Patient awake    Reviewed: Allergy & Precautions, NPO status , Patient's Chart, lab work & pertinent test results  Airway Mallampati: II  TM Distance: >3 FB     Dental  (+) Teeth Intact   Pulmonary shortness of breath,  breath sounds clear to auscultation        Cardiovascular negative cardio ROS  Rhythm:Regular Rate:Normal     Neuro/Psych    GI/Hepatic GERD-  ,  Endo/Other    Renal/GU      Musculoskeletal   Abdominal   Peds  Hematology  (+) anemia ,   Anesthesia Other Findings   Reproductive/Obstetrics                             Anesthesia Physical Anesthesia Plan  ASA: II  Anesthesia Plan: MAC   Post-op Pain Management:    Induction: Intravenous  Airway Management Planned: Nasal Cannula  Additional Equipment:   Intra-op Plan:   Post-operative Plan:   Informed Consent: I have reviewed the patients History and Physical, chart, labs and discussed the procedure including the risks, benefits and alternatives for the proposed anesthesia with the patient or authorized representative who has indicated his/her understanding and acceptance.     Plan Discussed with:   Anesthesia Plan Comments:         Anesthesia Quick Evaluation

## 2015-03-05 ENCOUNTER — Encounter (HOSPITAL_COMMUNITY): Payer: Self-pay | Admitting: Ophthalmology

## 2015-06-20 DIAGNOSIS — H903 Sensorineural hearing loss, bilateral: Secondary | ICD-10-CM | POA: Diagnosis not present

## 2015-07-16 DIAGNOSIS — K5792 Diverticulitis of intestine, part unspecified, without perforation or abscess without bleeding: Secondary | ICD-10-CM | POA: Diagnosis not present

## 2015-08-10 DIAGNOSIS — Z1231 Encounter for screening mammogram for malignant neoplasm of breast: Secondary | ICD-10-CM | POA: Diagnosis not present

## 2015-08-15 DIAGNOSIS — E782 Mixed hyperlipidemia: Secondary | ICD-10-CM | POA: Diagnosis not present

## 2015-08-22 DIAGNOSIS — R079 Chest pain, unspecified: Secondary | ICD-10-CM | POA: Diagnosis not present

## 2015-08-22 DIAGNOSIS — E782 Mixed hyperlipidemia: Secondary | ICD-10-CM | POA: Diagnosis not present

## 2015-08-22 DIAGNOSIS — D509 Iron deficiency anemia, unspecified: Secondary | ICD-10-CM | POA: Diagnosis not present

## 2015-08-22 DIAGNOSIS — Z23 Encounter for immunization: Secondary | ICD-10-CM | POA: Diagnosis not present

## 2015-08-22 DIAGNOSIS — R945 Abnormal results of liver function studies: Secondary | ICD-10-CM | POA: Diagnosis not present

## 2015-09-10 DIAGNOSIS — R69 Illness, unspecified: Secondary | ICD-10-CM | POA: Diagnosis not present

## 2015-09-24 DIAGNOSIS — R51 Headache: Secondary | ICD-10-CM | POA: Diagnosis not present

## 2015-11-05 DIAGNOSIS — J019 Acute sinusitis, unspecified: Secondary | ICD-10-CM | POA: Diagnosis not present

## 2015-12-07 DIAGNOSIS — B029 Zoster without complications: Secondary | ICD-10-CM | POA: Diagnosis not present

## 2016-01-01 DIAGNOSIS — R945 Abnormal results of liver function studies: Secondary | ICD-10-CM | POA: Diagnosis not present

## 2016-01-01 DIAGNOSIS — E782 Mixed hyperlipidemia: Secondary | ICD-10-CM | POA: Diagnosis not present

## 2016-01-03 DIAGNOSIS — E782 Mixed hyperlipidemia: Secondary | ICD-10-CM | POA: Diagnosis not present

## 2016-01-03 DIAGNOSIS — D509 Iron deficiency anemia, unspecified: Secondary | ICD-10-CM | POA: Diagnosis not present

## 2016-01-10 ENCOUNTER — Other Ambulatory Visit (HOSPITAL_COMMUNITY): Payer: Self-pay | Admitting: Internal Medicine

## 2016-01-10 DIAGNOSIS — R945 Abnormal results of liver function studies: Secondary | ICD-10-CM

## 2016-01-18 ENCOUNTER — Ambulatory Visit (HOSPITAL_COMMUNITY)
Admission: RE | Admit: 2016-01-18 | Discharge: 2016-01-18 | Disposition: A | Discharge status: Home / Self Care | Payer: Medicare HMO | Source: Ambulatory Visit | Attending: Internal Medicine | Admitting: Internal Medicine

## 2016-01-18 DIAGNOSIS — R935 Abnormal findings on diagnostic imaging of other abdominal regions, including retroperitoneum: Secondary | ICD-10-CM | POA: Diagnosis not present

## 2016-01-18 DIAGNOSIS — R945 Abnormal results of liver function studies: Secondary | ICD-10-CM | POA: Diagnosis not present

## 2016-01-18 DIAGNOSIS — R932 Abnormal findings on diagnostic imaging of liver and biliary tract: Secondary | ICD-10-CM | POA: Insufficient documentation

## 2016-01-18 DIAGNOSIS — R7989 Other specified abnormal findings of blood chemistry: Secondary | ICD-10-CM | POA: Diagnosis not present

## 2016-03-17 ENCOUNTER — Emergency Department (HOSPITAL_COMMUNITY)
Admission: EM | Admit: 2016-03-17 | Discharge: 2016-03-17 | Disposition: A | Discharge status: Home / Self Care | Payer: Medicare HMO | Attending: Emergency Medicine | Admitting: Emergency Medicine

## 2016-03-17 ENCOUNTER — Encounter (HOSPITAL_COMMUNITY): Payer: Self-pay | Admitting: *Deleted

## 2016-03-17 DIAGNOSIS — Z79899 Other long term (current) drug therapy: Secondary | ICD-10-CM | POA: Insufficient documentation

## 2016-03-17 DIAGNOSIS — K219 Gastro-esophageal reflux disease without esophagitis: Secondary | ICD-10-CM | POA: Insufficient documentation

## 2016-03-17 MED ORDER — CALCIUM CARBONATE ANTACID 500 MG PO CHEW
2.0000 | CHEWABLE_TABLET | Freq: Once | ORAL | Status: AC
  Administered 2016-03-17: 400 mg via ORAL
  Filled 2016-03-17: qty 2

## 2016-03-17 MED ORDER — FAMOTIDINE 20 MG PO TABS
20.0000 mg | ORAL_TABLET | Freq: Once | ORAL | Status: AC
  Administered 2016-03-17: 20 mg via ORAL
  Filled 2016-03-17: qty 1

## 2016-03-17 MED ORDER — ALUM & MAG HYDROXIDE-SIMETH 200-200-20 MG/5ML PO SUSP
30.0000 mL | Freq: Once | ORAL | Status: AC
  Administered 2016-03-17: 30 mL via ORAL
  Filled 2016-03-17: qty 30

## 2016-03-17 MED ORDER — SUCRALFATE 1 GM/10ML PO SUSP
1.0000 g | Freq: Once | ORAL | Status: AC
  Administered 2016-03-17: 1 g via ORAL
  Filled 2016-03-17: qty 10

## 2016-03-17 NOTE — ED Provider Notes (Signed)
CSN: [id number]     Arrival date & time 03/17/16  0103 History   First MD Initiated Contact with Patient 03/17/16 0240 AM    Chief Complaint  Patient presents with  . Gastroesophageal Reflux     (Consider location/radiation/quality/duration/timing/severity/associated sxs/prior Treatment) HPI patient reports she has problems of acid reflux and it flares up a few times a year. She states she was awakened at 12:30 AM today with the feeling of fluid and acid in her throat. Tried Rolaids and Tums which usually helps, but didn't help tonight. She also took an extra Prilosec without improvement.  She states she had a lot of mucus in her throat and tried to gargle but her throat felt numb and she couldn't gargle. She states this scared her and precipitated her coming to the ED. She states laying flat makes the acid worse, sitting up makes a little bit better. She still states like she has a lot of acid in her throat.  PCP Dr Nevada Crane  Past Medical History  Diagnosis Date  . Diverticulitis   . Acid reflux disease   . HOH (hard of hearing)   . Shortness of breath dyspnea   . Arthritis   . Anemia    Past Surgical History  Procedure Laterality Date  . Abdominal hysterectomy    . Hemorroidectomy    . Myomectomy  1976  . Cataract extraction w/phaco Left 02/01/2015    Procedure: CATARACT EXTRACTION PHACO AND INTRAOCULAR LENS PLACEMENT (IOC);  Surgeon: Tonny Branch, MD;  Location: [facility];  Service: Ophthalmology;  Laterality: Left;  CDE 6.48  . Cataract extraction w/phaco Right 03/01/2015    Procedure: CATARACT EXTRACTION PHACO AND INTRAOCULAR LENS PLACEMENT RIGHT EYE; CDE: 4.09;  Surgeon: Tonny Branch, MD;  Location: [facility];  Service: Ophthalmology;  Laterality: Right;   No family history on file. Social History  Substance Use Topics  . Smoking status: Never Smoker   . Smokeless tobacco: None  . Alcohol Use: 0.6 oz/week    1 Glasses of wine per week   Lives at home Lives alone  OB History    No  data available     Review of Systems  All other systems reviewed and are negative.     Allergies  Codeine  Home Medications   Prior to Admission medications   Medication Sig Start Date End Date Taking? Authorizing Provider  Acetaminophen-Caffeine (EXCEDRIN TENSION HEADACHE) 500-65 MG TABS Take 1-2 tablets by mouth daily as needed (for pain).   Yes Historical Provider, MD  BESIVANCE 0.6 % SUSP Apply 1 drop to eye as directed.  01/27/15  Yes Historical Provider, MD  Lactobacillus (ACIDOPHILUS PO) Take 2-3 capsules by mouth 2 (two) times daily. Three capsules in the morning and two capsules at bedtime   Yes Historical Provider, MD  Multiple Vitamin (MULTIVITAMIN WITH MINERALS) TABS tablet Take 1 tablet by mouth daily.   Yes Historical Provider, MD  Omega-3 Fatty Acids (FISH OIL PO) Take 1 capsule by mouth daily.   Yes Historical Provider, MD  omeprazole (PRILOSEC OTC) 20 MG tablet Take 20 mg by mouth daily.   Yes Historical Provider, MD  PROLENSA 0.07 % SOLN Apply 1 drop to eye as directed.  01/27/15  Yes Historical Provider, MD  Red Yeast Rice Extract (RED YEAST RICE PO) Take 1 tablet by mouth daily.   Yes Historical Provider, MD  ondansetron (ZOFRAN) 8 MG tablet Take 1 tablet (8 mg total) by mouth every 4 (four) hours as needed. Patient not  taking: Reported on 01/22/2015 04/03/14   Nat Christen, MD   BP 160/80 mmHg  Pulse 62  Temp(Src) 98 F (36.7 C) (Oral)  Resp 17  Ht 5\' 3"  (1.6 m)  Wt 165 lb (74.844 kg)  BMI 29.24 kg/m2  SpO2 98% Physical Exam  Constitutional: She is oriented to person, place, and time. She appears well-developed and well-nourished.  Non-toxic appearance. She does not appear ill. No distress.  HENT:  Head: Normocephalic and atraumatic.  Right Ear: External ear normal.  Left Ear: External ear normal.  Nose: Nose normal. No mucosal edema or rhinorrhea.  Mouth/Throat: Oropharynx is clear and moist and mucous membranes are normal. No dental abscesses or uvula  swelling.  Eyes: Conjunctivae and EOM are normal. Pupils are equal, round, and reactive to light.  Neck: Normal range of motion and full passive range of motion without pain. Neck supple.  Cardiovascular: Normal rate, regular rhythm and normal heart sounds.  Exam reveals no gallop and no friction rub.   No murmur heard. Pulmonary/Chest: Effort normal and breath sounds normal. No respiratory distress. She has no wheezes. She has no rhonchi. She has no rales. She exhibits no tenderness and no crepitus.  Abdominal: Soft. Normal appearance and bowel sounds are normal. She exhibits no distension. There is no tenderness. There is no rebound and no guarding.  Musculoskeletal: Normal range of motion. She exhibits no edema or tenderness.  Moves all extremities well.   Neurological: She is alert and oriented to person, place, and time. She has normal strength. No cranial nerve deficit.  Skin: Skin is warm, dry and intact. No rash noted. No erythema. No pallor.  Psychiatric: She has a normal mood and affect. Her speech is normal and behavior is normal. Her mood appears not anxious.  Nursing note and vitals reviewed.   ED Course  Procedures (including critical care time)  Medications  alum & mag hydroxide-simeth (MAALOX/MYLANTA) 200-200-20 MG/5ML suspension 30 mL (30 mLs Oral Given 03/17/16 0258)  famotidine (PEPCID) tablet 20 mg (20 mg Oral Given 03/17/16 0258)  sucralfate (CARAFATE) 1 GM/10ML suspension 1 g (1 g Oral Given 03/17/16 0258)  calcium carbonate (TUMS - dosed in mg elemental calcium) chewable tablet 400 mg of elemental calcium (400 mg of elemental calcium Oral Given 03/17/16 0455)    Patient was given Maalox and Mylanta, Pepcid orally and Carafate.  Patient was rechecked at 4 AM. States she still has the acid in her throat although it's better. She was given 2 times.  Recheck at 5:15 AM patient states her symptoms are improved. She still has minor burning. She feels better enough to be  discharged. She states she actually has a colonoscopy scheduled at 1:30 this afternoon with Dr. Britta Mccreedy in [place]. She was advised to discuss what happened tonight with him when she sees him later today. She was advised for now to increase her omeprazole to twice a day and then back down to once a day unless Dr. Britta Mccreedy wants her to do something else.    EKG Interpretation   Date/Time:  Monday March 17 2016 03:06:27 EDT Ventricular Rate:  59 PR Interval:    QRS Duration: 110 QT Interval:  456 QTC Calculation: 452 R Axis:   -77 Text Interpretation:  Sinus rhythm Left anterior fascicular block Low  voltage, precordial leads Consider anterior infarct No significant change  since last tracing 24 Jan 2015 Confirmed by [facility]  MD-I, Kinzee Happel ([id number]) on  03/17/2016 3:29:09 AM      MDM  Final diagnoses:  Gastroesophageal reflux disease, esophagitis presence not specified    Plan discharge   Rolland Porter, MD, Barbette Or, MD 03/17/16 [phone]

## 2016-03-17 NOTE — ED Notes (Signed)
Pt states she woke this morning w/ acid reflux. Pt still has a burning in her throat & did not go away after taking tums.

## 2016-03-17 NOTE — Discharge Instructions (Signed)
Increase your omeprazole to twice a day for the next 2 weeks then once a day. Please discuss what happened tonight with your gastroenterologist when he see him later this afternoon. He may have other instructions that he wants you to do. Please look at the GERD diet information.   Food Choices for Gastroesophageal Reflux Disease, Adult When you have gastroesophageal reflux disease (GERD), the foods you eat and your eating habits are very important. Choosing the right foods can help ease the discomfort of GERD. WHAT GENERAL GUIDELINES DO I NEED TO FOLLOW?  Choose fruits, vegetables, whole grains, low-fat dairy products, and low-fat meat, fish, and poultry.  Limit fats such as oils, salad dressings, butter, nuts, and avocado.  Keep a food diary to identify foods that cause symptoms.  Avoid foods that cause reflux. These may be different for different people.  Eat frequent small meals instead of three large meals each day.  Eat your meals slowly, in a relaxed setting.  Limit fried foods.  Cook foods using methods other than frying.  Avoid drinking alcohol.  Avoid drinking large amounts of liquids with your meals.  Avoid bending over or lying down until 2-3 hours after eating. WHAT FOODS ARE NOT RECOMMENDED? The following are some foods and drinks that may worsen your symptoms: Vegetables Tomatoes. Tomato juice. Tomato and spaghetti sauce. Chili peppers. Onion and garlic. Horseradish. Fruits Oranges, grapefruit, and lemon (fruit and juice). Meats High-fat meats, fish, and poultry. This includes hot dogs, ribs, ham, sausage, salami, and bacon. Dairy Whole milk and chocolate milk. Sour cream. Cream. Butter. Ice cream. Cream cheese.  Beverages Coffee and tea, with or without caffeine. Carbonated beverages or energy drinks. Condiments Hot sauce. Barbecue sauce.  Sweets/Desserts Chocolate and cocoa. Donuts. Peppermint and spearmint. Fats and Oils High-fat foods, including [place]  fries and potato chips. Other Vinegar. Strong spices, such as black pepper, white pepper, red pepper, cayenne, curry powder, cloves, ginger, and chili powder. The items listed above may not be a complete list of foods and beverages to avoid. Contact your dietitian for more information.   This information is not intended to replace advice given to you by your health care provider. Make sure you discuss any questions you have with your health care provider.   Document Released: 08/25/2005 Document Revised: 09/15/2014 Document Reviewed: 06/29/2013 Elsevier Interactive Patient Education 2016 [employer].  Gastroesophageal Reflux Disease, Adult Normally, food travels down the esophagus and stays in the stomach to be digested. However, when a person has gastroesophageal reflux disease (GERD), food and stomach acid move back up into the esophagus. When this happens, the esophagus becomes sore and inflamed. Over time, GERD can create small holes (ulcers) in the lining of the esophagus.  CAUSES This condition is caused by a problem with the muscle between the esophagus and the stomach (lower esophageal sphincter, or LES). Normally, the LES muscle closes after food passes through the esophagus to the stomach. When the LES is weakened or abnormal, it does not close properly, and that allows food and stomach acid to go back up into the esophagus. The LES can be weakened by certain dietary substances, medicines, and medical conditions, including:  Tobacco use.  Pregnancy.  Having a hiatal hernia.  Heavy alcohol use.  Certain foods and beverages, such as coffee, chocolate, onions, and peppermint. RISK FACTORS This condition is more likely to develop in:  People who have an increased body weight.  People who have connective tissue disorders.  People who use NSAID medicines.  SYMPTOMS Symptoms of this condition include:  Heartburn.  Difficult or painful swallowing.  The feeling of having a  lump in the throat.  Abitter taste in the mouth.  Bad breath.  Having a large amount of saliva.  Having an upset or bloated stomach.  Belching.  Chest pain.  Shortness of breath or wheezing.  Ongoing (chronic) cough or a night-time cough.  Wearing away of tooth enamel.  Weight loss. Different conditions can cause chest pain. Make sure to see your health care provider if you experience chest pain. DIAGNOSIS Your health care provider will take a medical history and perform a physical exam. To determine if you have mild or severe GERD, your health care provider may also monitor how you respond to treatment. You may also have other tests, including:  An endoscopy toexamine your stomach and esophagus with a small camera.  A test thatmeasures the acidity level in your esophagus.  A test thatmeasures how much pressure is on your esophagus.  A barium swallow or modified barium swallow to show the shape, size, and functioning of your esophagus. TREATMENT The goal of treatment is to help relieve your symptoms and to prevent complications. Treatment for this condition may vary depending on how severe your symptoms are. Your health care provider may recommend:  Changes to your diet.  Medicine.  Surgery. HOME CARE INSTRUCTIONS Diet  Follow a diet as recommended by your health care provider. This may involve avoiding foods and drinks such as:  Coffee and tea (with or without caffeine).  Drinks that containalcohol.  Energy drinks and sports drinks.  Carbonated drinks or sodas.  Chocolate and cocoa.  Peppermint and mint flavorings.  Garlic and onions.  Horseradish.  Spicy and acidic foods, including peppers, chili powder, curry powder, vinegar, hot sauces, and barbecue sauce.  Citrus fruit juices and citrus fruits, such as oranges, lemons, and limes.  Tomato-based foods, such as red sauce, chili, salsa, and pizza with red sauce.  Fried and fatty foods, such as  donuts, french fries, potato chips, and high-fat dressings.  High-fat meats, such as hot dogs and fatty cuts of red and white meats, such as rib eye steak, sausage, ham, and bacon.  High-fat dairy items, such as whole milk, butter, and cream cheese.  Eat small, frequent meals instead of large meals.  Avoid drinking large amounts of liquid with your meals.  Avoid eating meals during the 2-3 hours before bedtime.  Avoid lying down right after you eat.  Do not exercise right after you eat. General Instructions  Pay attention to any changes in your symptoms.  Take over-the-counter and prescription medicines only as told by your health care provider. Do not take aspirin, ibuprofen, or other NSAIDs unless your health care provider told you to do so.  Do not use any tobacco products, including cigarettes, chewing tobacco, and e-cigarettes. If you need help quitting, ask your health care provider.  Wear loose-fitting clothing. Do not wear anything tight around your waist that causes pressure on your abdomen.  Raise (elevate) the head of your bed 6 inches (15cm).  Try to reduce your stress, such as with yoga or meditation. If you need help reducing stress, ask your health care provider.  If you are overweight, reduce your weight to an amount that is healthy for you. Ask your health care provider for guidance about a safe weight loss goal.  Keep all follow-up visits as told by your health care provider. This is important. SEEK MEDICAL CARE IF:  You have new symptoms.  You have unexplained weight loss.  You have difficulty swallowing, or it hurts to swallow.  You have wheezing or a persistent cough.  Your symptoms do not improve with treatment.  You have a hoarse voice. SEEK IMMEDIATE MEDICAL CARE IF:  You have pain in your arms, neck, jaw, teeth, or back.  You feel sweaty, dizzy, or light-headed.  You have chest pain or shortness of breath.  You vomit and your vomit looks  like blood or coffee grounds.  You faint.  Your stool is bloody or black.  You cannot swallow, drink, or eat.   This information is not intended to replace advice given to you by your health care provider. Make sure you discuss any questions you have with your health care provider.   Document Released: 06/04/2005 Document Revised: 05/16/2015 Document Reviewed: 12/20/2014 Elsevier Interactive Patient Education [employer].

## 2016-04-11 DIAGNOSIS — Z803 Family history of malignant neoplasm of breast: Secondary | ICD-10-CM | POA: Diagnosis not present

## 2016-04-11 DIAGNOSIS — E785 Hyperlipidemia, unspecified: Secondary | ICD-10-CM | POA: Diagnosis not present

## 2016-04-11 DIAGNOSIS — Z1211 Encounter for screening for malignant neoplasm of colon: Secondary | ICD-10-CM | POA: Diagnosis not present

## 2016-04-11 DIAGNOSIS — Z79899 Other long term (current) drug therapy: Secondary | ICD-10-CM | POA: Diagnosis not present

## 2016-04-11 DIAGNOSIS — Z800 Family history of malignant neoplasm of digestive organs: Secondary | ICD-10-CM | POA: Diagnosis not present

## 2016-04-11 DIAGNOSIS — K5730 Diverticulosis of large intestine without perforation or abscess without bleeding: Secondary | ICD-10-CM | POA: Diagnosis not present

## 2016-04-11 DIAGNOSIS — Z7982 Long term (current) use of aspirin: Secondary | ICD-10-CM | POA: Diagnosis not present

## 2016-04-11 DIAGNOSIS — Z86010 Personal history of colonic polyps: Secondary | ICD-10-CM | POA: Diagnosis not present

## 2016-04-11 DIAGNOSIS — K219 Gastro-esophageal reflux disease without esophagitis: Secondary | ICD-10-CM | POA: Diagnosis not present

## 2016-04-11 DIAGNOSIS — Z886 Allergy status to analgesic agent status: Secondary | ICD-10-CM | POA: Diagnosis not present

## 2016-06-11 DIAGNOSIS — Z6829 Body mass index (BMI) 29.0-29.9, adult: Secondary | ICD-10-CM | POA: Diagnosis not present

## 2016-06-11 DIAGNOSIS — M25569 Pain in unspecified knee: Secondary | ICD-10-CM | POA: Diagnosis not present

## 2016-06-25 DIAGNOSIS — E782 Mixed hyperlipidemia: Secondary | ICD-10-CM | POA: Diagnosis not present

## 2016-06-25 DIAGNOSIS — R945 Abnormal results of liver function studies: Secondary | ICD-10-CM | POA: Diagnosis not present

## 2016-06-25 DIAGNOSIS — D509 Iron deficiency anemia, unspecified: Secondary | ICD-10-CM | POA: Diagnosis not present

## 2016-06-27 DIAGNOSIS — D509 Iron deficiency anemia, unspecified: Secondary | ICD-10-CM | POA: Diagnosis not present

## 2016-06-27 DIAGNOSIS — R945 Abnormal results of liver function studies: Secondary | ICD-10-CM | POA: Diagnosis not present

## 2016-06-27 DIAGNOSIS — Z8719 Personal history of other diseases of the digestive system: Secondary | ICD-10-CM | POA: Diagnosis not present

## 2016-06-27 DIAGNOSIS — Z23 Encounter for immunization: Secondary | ICD-10-CM | POA: Diagnosis not present

## 2016-06-27 DIAGNOSIS — E782 Mixed hyperlipidemia: Secondary | ICD-10-CM | POA: Diagnosis not present

## 2016-08-23 ENCOUNTER — Encounter (HOSPITAL_COMMUNITY): Payer: Self-pay | Admitting: Emergency Medicine

## 2016-08-23 ENCOUNTER — Emergency Department (HOSPITAL_COMMUNITY)
Admission: EM | Admit: 2016-08-23 | Discharge: 2016-08-23 | Disposition: A | Discharge status: Home / Self Care | Payer: Medicare HMO | Attending: Emergency Medicine | Admitting: Emergency Medicine

## 2016-08-23 DIAGNOSIS — D1809 Hemangioma of other sites: Secondary | ICD-10-CM | POA: Diagnosis not present

## 2016-08-23 DIAGNOSIS — Z79899 Other long term (current) drug therapy: Secondary | ICD-10-CM | POA: Insufficient documentation

## 2016-08-23 DIAGNOSIS — N939 Abnormal uterine and vaginal bleeding, unspecified: Secondary | ICD-10-CM | POA: Diagnosis not present

## 2016-08-23 LAB — URINALYSIS, ROUTINE W REFLEX MICROSCOPIC
Bilirubin Urine: NEGATIVE
GLUCOSE, UA: NEGATIVE mg/dL
KETONES UR: NEGATIVE mg/dL
Nitrite: NEGATIVE
Protein, ur: 30 mg/dL — AB
SPECIFIC GRAVITY, URINE: 1.019 (ref 1.005–1.030)
pH: 5 (ref 5.0–8.0)

## 2016-08-23 MED ORDER — SILVER NITRATE-POT NITRATE 75-25 % EX MISC
CUTANEOUS | Status: AC
  Administered 2016-08-23: 22:00:00
  Filled 2016-08-23: qty 2

## 2016-08-23 MED ORDER — SILVER NITRATE-POT NITRATE 75-25 % EX MISC
CUTANEOUS | Status: AC
  Filled 2016-08-23: qty 1

## 2016-08-23 MED ORDER — LIDOCAINE-EPINEPHRINE 2 %-1:200000 IJ SOLN
INTRAMUSCULAR | Status: AC
Start: 2016-08-23 — End: 2016-08-23
  Administered 2016-08-23: 22:00:00
  Filled 2016-08-23: qty 20

## 2016-08-23 NOTE — ED Provider Notes (Signed)
[facility] DEPT Provider Note   CSN: [id number] Arrival date & time: 08/23/16  2012     History   Chief Complaint Chief Complaint  Patient presents with  . Vaginal Bleeding    HPI Cadince Szopinski is a 70 y.o. female.  HPI  The patient is a 70 year old female, she has a history of a hysterectomy that was performed after she was diagnosed with endometriosis with a very large uterus and chronic abdominal pain. This was done in the 1980s, she has not seen a gynecologist in quite some time. She reports that approximately 3 hours ago she started to have the feeling of needing to urinate, when she went to the bathroom she was wiping away large amounts of bright red blood clots and dark red blood clots from her vaginal area. She also had a feeling of some urinary hesitancy and some difficulty maintaining his stream. She does have known hemorrhoids and considered that this could be hemorrhoidal bleeding but because of the large amounts of clot she came to the emergency department. She denies abdominal pain chest pain shortness of breath headache numbness weakness diarrhea constipation swelling of the legs. She takes a baby aspirin but no other anticoagulants. She denies any recent intercourse or foreign bodies.  Past Medical History:  Diagnosis Date  . Acid reflux disease   . Anemia   . Arthritis   . Diverticulitis   . HOH (hard of hearing)   . Shortness of breath dyspnea     Patient Active Problem List   Diagnosis Date Noted  . Nausea with vomiting 04/03/2014    Past Surgical History:  Procedure Laterality Date  . ABDOMINAL HYSTERECTOMY    . CATARACT EXTRACTION W/PHACO Left 02/01/2015   Procedure: CATARACT EXTRACTION PHACO AND INTRAOCULAR LENS PLACEMENT (IOC);  Surgeon: Tonny Branch, MD;  Location: [facility];  Service: Ophthalmology;  Laterality: Left;  CDE 6.48  . CATARACT EXTRACTION W/PHACO Right 03/01/2015   Procedure: CATARACT EXTRACTION PHACO AND INTRAOCULAR LENS PLACEMENT  RIGHT EYE; CDE: 4.09;  Surgeon: Tonny Branch, MD;  Location: [facility];  Service: Ophthalmology;  Laterality: Right;  . HEMORROIDECTOMY    . MYOMECTOMY  1976    OB History    Gravida Para Term Preterm AB Living             0   SAB TAB Ectopic Multiple Live Births                   Home Medications    Prior to Admission medications   Medication Sig Start Date End Date Taking? Authorizing Provider  Acetaminophen-Caffeine (EXCEDRIN TENSION HEADACHE) 500-65 MG TABS Take 1-2 tablets by mouth daily as needed (for pain).   Yes Historical Provider, MD  cetirizine (ZYRTEC) 10 MG tablet Take 10 mg by mouth daily.   Yes Historical Provider, MD  GuaiFENesin (MUCINEX PO) Take 600 mg by mouth 2 (two) times daily.   Yes Historical Provider, MD  Multiple Vitamin (MULTIVITAMIN WITH MINERALS) TABS tablet Take 1 tablet by mouth daily.   Yes Historical Provider, MD  Omega-3 Fatty Acids (FISH OIL PO) Take 1 capsule by mouth daily.   Yes Historical Provider, MD  omeprazole (PRILOSEC OTC) 20 MG tablet Take 20 mg by mouth daily.   Yes Historical Provider, MD  pravastatin (PRAVACHOL) 20 MG tablet Take 1 tablet by mouth daily. 08/20/16  Yes Historical Provider, MD    Family History History reviewed. No pertinent family history.  Social History Social History  Substance Use  Topics  . Smoking status: Never Smoker  . Smokeless tobacco: Never Used  . Alcohol use 0.6 oz/week    1 Glasses of wine per week     Allergies   Codeine   Review of Systems Review of Systems  All other systems reviewed and are negative.    Physical Exam Updated Vital Signs BP 172/88 (BP Location: Left Arm)   Pulse 71   Temp 98.3 F (36.8 C) (Oral)   Resp 18   Ht 5\' 3"  (1.6 m)   Wt 170 lb (77.1 kg)   SpO2 95%   BMI 30.11 kg/m   Physical Exam  Constitutional: She appears well-developed and well-nourished. No distress.  HENT:  Head: Normocephalic and atraumatic.  Mouth/Throat: Oropharynx is clear and moist. No  oropharyngeal exudate.  Eyes: Conjunctivae and EOM are normal. Pupils are equal, round, and reactive to light. Right eye exhibits no discharge. Left eye exhibits no discharge. No scleral icterus.  Neck: Normal range of motion. Neck supple. No JVD present. No thyromegaly present.  Cardiovascular: Normal rate, regular rhythm, normal heart sounds and intact distal pulses.  Exam reveals no gallop and no friction rub.   No murmur heard. Pulmonary/Chest: Effort normal and breath sounds normal. No respiratory distress. She has no wheezes. She has no rales.  Abdominal: Soft. Bowel sounds are normal. She exhibits no distension and no mass. There is no tenderness.  Genitourinary:  Genitourinary Comments: Chaperone present for exam, normal-appearing external genitalia, normal appearing urethra opening, small protruding pedunculated polyp that is actively having a very slow bleed. There is no blood in the vaginal vault, no masses in the vaginal vault, no foreign bodies  Musculoskeletal: Normal range of motion. She exhibits no edema or tenderness.  Lymphadenopathy:    She has no cervical adenopathy.  Neurological: She is alert. Coordination normal.  Skin: Skin is warm and dry. No rash noted. No erythema.  Psychiatric: She has a normal mood and affect. Her behavior is normal.  Nursing note and vitals reviewed.    ED Treatments / Results  Labs (all labs ordered are listed, but only abnormal results are displayed) Labs Reviewed  URINALYSIS, [place]     Radiology No results found.  Procedures Procedures (including critical care time)  Medications Ordered in ED Medications  lidocaine-EPINEPHrine (XYLOCAINE W/EPI) 2 %-1:200000 (PF) injection (  Given by Other 08/23/16 2155)  silver nitrate applicators [id number] % applicator (  Given by Other 08/23/16 2155)     Initial Impression / Assessment and Plan / ED Course  I have reviewed the triage vital signs and  the nursing notes.  Pertinent labs & imaging results that were available during my care of the patient were reviewed by me and considered in my medical decision making (see chart for details).  Clinical Course     OB/GYN was paged at approximately 9:00 PM  After discussion with Dr. Glo Herring for decision was made to surgically excise the small polyp from the vaginal introitus, lidocaine was injected underneath the polyp, anesthesia was obtained, sharp excision with a small  Scissors and forceps was used without any complications. Bleeding was controlled with pressure and silver nitrate, patient will follow-up in the office of Dr. Glo Herring, sample sent for pathology sections.  Final Clinical Impressions(s) / ED Diagnoses   Final diagnoses:  Vaginal bleeding    New Prescriptions New Prescriptions   No medications on file     Noemi Chapel, MD 08/23/16 2156

## 2016-08-23 NOTE — ED Triage Notes (Signed)
Pt reports sudden onset of vaginal bleeding this evening. Pt states she has had a hysterectomy. Pt c/o large amount of clots.

## 2016-08-23 NOTE — Discharge Instructions (Signed)
Please call Dr. Johnnye Sima office for an appointment to be seen in the next couple of days - He has office days on Tuesday

## 2016-08-26 DIAGNOSIS — Z1231 Encounter for screening mammogram for malignant neoplasm of breast: Secondary | ICD-10-CM | POA: Diagnosis not present

## 2016-08-28 ENCOUNTER — Ambulatory Visit (INDEPENDENT_AMBULATORY_CARE_PROVIDER_SITE_OTHER): Payer: Medicare HMO | Admitting: Obstetrics and Gynecology

## 2016-08-28 ENCOUNTER — Encounter: Payer: Self-pay | Admitting: Obstetrics and Gynecology

## 2016-08-28 VITALS — BP 140/98 | HR 76 | Wt 173.6 lb

## 2016-08-28 DIAGNOSIS — N938 Other specified abnormal uterine and vaginal bleeding: Secondary | ICD-10-CM | POA: Diagnosis not present

## 2016-08-28 DIAGNOSIS — L980 Pyogenic granuloma: Secondary | ICD-10-CM

## 2016-08-28 DIAGNOSIS — R030 Elevated blood-pressure reading, without diagnosis of hypertension: Secondary | ICD-10-CM | POA: Diagnosis not present

## 2016-08-28 NOTE — Progress Notes (Addendum)
Patient ID: Gizele Speciale, female   DOB: 19-Nov-1945, 70 y.o.   MRN: [id number]    [facility] Visit  @DATE @            Patient name: Carrie Mayer MRN [id number]  Date of birth: Dec 24, 1945  CC & HPI:   Chief Complaint  Patient presents with  . Follow-up    vaginal bleeding     Carrie Mayer is a 70 y.o. female presenting today for f/u of pathology results of vaginal polyp removed in the ED on 08/23/16. Pt states the area seems to have healed well.   Biopsy results:  Vagina, biopsy, vaginal introitus (inferior) - PYOGENIC GRANULOMA WITH SURFACE ULCERATION (LOBULATED HEMANGIOMA). - NO EVIDENCE OF MALIGNANCY.  ROS:  ROS No complaints.   Pertinent History Reviewed:   Reviewed: Significant for abdominal hysterectomy  Medical         Past Medical History:  Diagnosis Date  . Acid reflux disease   . Anemia   . Arthritis   . Diverticulitis   . HOH (hard of hearing)   . Shortness of breath dyspnea                               Surgical Hx:    Past Surgical History:  Procedure Laterality Date  . ABDOMINAL HYSTERECTOMY    . CATARACT EXTRACTION W/PHACO Left 02/01/2015   Procedure: CATARACT EXTRACTION PHACO AND INTRAOCULAR LENS PLACEMENT (IOC);  Surgeon: Tonny Branch, MD;  Location: [facility];  Service: Ophthalmology;  Laterality: Left;  CDE 6.48  . CATARACT EXTRACTION W/PHACO Right 03/01/2015   Procedure: CATARACT EXTRACTION PHACO AND INTRAOCULAR LENS PLACEMENT RIGHT EYE; CDE: 4.09;  Surgeon: Tonny Branch, MD;  Location: [facility];  Service: Ophthalmology;  Laterality: Right;  . HEMORROIDECTOMY    . MYOMECTOMY  1976  . polyp removal     Medications: Reviewed & Updated - see associated section                       Current Outpatient Prescriptions:  .  Acetaminophen-Caffeine (EXCEDRIN TENSION HEADACHE) 500-65 MG TABS, Take 1-2 tablets by mouth daily as needed (for pain)., Disp: , Rfl:  .  cetirizine (ZYRTEC) 10 MG tablet, Take 10 mg by mouth daily., Disp: , Rfl:  .   GuaiFENesin (MUCINEX PO), Take 600 mg by mouth 2 (two) times daily., Disp: , Rfl:  .  Multiple Vitamin (MULTIVITAMIN WITH MINERALS) TABS tablet, Take 1 tablet by mouth daily., Disp: , Rfl:  .  Omega-3 Fatty Acids (FISH OIL PO), Take 1 capsule by mouth daily., Disp: , Rfl:  .  omeprazole (PRILOSEC OTC) 20 MG tablet, Take 20 mg by mouth daily., Disp: , Rfl:  .  pravastatin (PRAVACHOL) 20 MG tablet, Take 1 tablet by mouth daily., Disp: , Rfl:    Social History: Reviewed -  reports that she has never smoked. She has never used smokeless tobacco.  Objective Findings:  Vitals: Blood pressure (!) 140/98, pulse 76, weight 173 lb 9.6 oz (78.7 kg).  Physical Examination: General appearance - alert, well appearing, and in no distress Mental status - alert, oriented to person, place, and time Abdomen - soft, nontender, nondistended, no masses or organomegaly Pelvic -  VULVA: normal appearing vulva with no masses, tenderness or lesions,  VAGINA: normal appearing vagina with normal color and discharge. Well healing posterior perineal body.  Skin - normal coloration and turgor, no rashes,  no suspicious skin lesions noted. Skin tag on right buttock.  Lesion is healing well , still with moist eschar 5 mm lesion at midline of perineal body  Assessment & Plan:   A: Pyogenic Granuloma, vulva 1. F/u for pathology results, wound check of recent vaginal polyp excision   P:  1. F/u PRN     By signing my name below, I, Hansel Feinstein, attest that this documentation has been prepared under the direction and in the presence of Jonnie Kind, MD. Electronically Signed: Hansel Feinstein, ED Scribe. 08/28/16. 4:11 PM.  I personally performed the services described in this documentation, which was SCRIBED in my presence. The recorded information has been reviewed and considered accurate. It has been edited as necessary during review. Jonnie Kind, MD

## 2016-09-22 DIAGNOSIS — R69 Illness, unspecified: Secondary | ICD-10-CM | POA: Diagnosis not present

## 2016-09-30 DIAGNOSIS — E782 Mixed hyperlipidemia: Secondary | ICD-10-CM | POA: Diagnosis not present

## 2016-10-03 DIAGNOSIS — N842 Polyp of vagina: Secondary | ICD-10-CM | POA: Diagnosis not present

## 2016-10-03 DIAGNOSIS — E782 Mixed hyperlipidemia: Secondary | ICD-10-CM | POA: Diagnosis not present

## 2016-10-03 DIAGNOSIS — D509 Iron deficiency anemia, unspecified: Secondary | ICD-10-CM | POA: Diagnosis not present

## 2016-10-03 DIAGNOSIS — Z0000 Encounter for general adult medical examination without abnormal findings: Secondary | ICD-10-CM | POA: Diagnosis not present

## 2016-10-03 DIAGNOSIS — Z23 Encounter for immunization: Secondary | ICD-10-CM | POA: Diagnosis not present

## 2016-10-03 DIAGNOSIS — Z8719 Personal history of other diseases of the digestive system: Secondary | ICD-10-CM | POA: Diagnosis not present

## 2016-10-03 DIAGNOSIS — R945 Abnormal results of liver function studies: Secondary | ICD-10-CM | POA: Diagnosis not present

## 2016-12-31 DIAGNOSIS — E785 Hyperlipidemia, unspecified: Secondary | ICD-10-CM | POA: Diagnosis not present

## 2017-01-02 DIAGNOSIS — D509 Iron deficiency anemia, unspecified: Secondary | ICD-10-CM | POA: Diagnosis not present

## 2017-01-02 DIAGNOSIS — L980 Pyogenic granuloma: Secondary | ICD-10-CM | POA: Diagnosis not present

## 2017-01-02 DIAGNOSIS — E782 Mixed hyperlipidemia: Secondary | ICD-10-CM | POA: Diagnosis not present

## 2017-01-02 DIAGNOSIS — Z6830 Body mass index (BMI) 30.0-30.9, adult: Secondary | ICD-10-CM | POA: Diagnosis not present

## 2017-01-02 DIAGNOSIS — R945 Abnormal results of liver function studies: Secondary | ICD-10-CM | POA: Diagnosis not present

## 2017-01-02 DIAGNOSIS — Z8719 Personal history of other diseases of the digestive system: Secondary | ICD-10-CM | POA: Diagnosis not present

## 2017-01-16 DIAGNOSIS — R945 Abnormal results of liver function studies: Secondary | ICD-10-CM | POA: Diagnosis not present

## 2017-01-16 DIAGNOSIS — D509 Iron deficiency anemia, unspecified: Secondary | ICD-10-CM | POA: Diagnosis not present

## 2017-03-16 DIAGNOSIS — H9193 Unspecified hearing loss, bilateral: Secondary | ICD-10-CM | POA: Diagnosis not present

## 2017-03-16 DIAGNOSIS — Z7982 Long term (current) use of aspirin: Secondary | ICD-10-CM | POA: Diagnosis not present

## 2017-03-16 DIAGNOSIS — E7800 Pure hypercholesterolemia, unspecified: Secondary | ICD-10-CM | POA: Diagnosis not present

## 2017-03-16 DIAGNOSIS — M2450 Contracture, unspecified joint: Secondary | ICD-10-CM | POA: Diagnosis not present

## 2017-03-16 DIAGNOSIS — K219 Gastro-esophageal reflux disease without esophagitis: Secondary | ICD-10-CM | POA: Diagnosis not present

## 2017-03-16 DIAGNOSIS — R011 Cardiac murmur, unspecified: Secondary | ICD-10-CM | POA: Diagnosis not present

## 2017-03-16 DIAGNOSIS — E669 Obesity, unspecified: Secondary | ICD-10-CM | POA: Diagnosis not present

## 2017-03-16 DIAGNOSIS — Z6830 Body mass index (BMI) 30.0-30.9, adult: Secondary | ICD-10-CM | POA: Diagnosis not present

## 2017-03-16 DIAGNOSIS — J309 Allergic rhinitis, unspecified: Secondary | ICD-10-CM | POA: Diagnosis not present

## 2017-03-16 DIAGNOSIS — Z0000 Encounter for general adult medical examination without abnormal findings: Secondary | ICD-10-CM | POA: Diagnosis not present

## 2017-03-30 DIAGNOSIS — R69 Illness, unspecified: Secondary | ICD-10-CM | POA: Diagnosis not present

## 2017-04-14 DIAGNOSIS — R69 Illness, unspecified: Secondary | ICD-10-CM | POA: Diagnosis not present

## 2017-06-11 IMAGING — US US ABDOMEN LIMITED
1 series · 14 of 25 positions shown · non-contrast
Comparison: CT abdomen pelvis dated 04/02/2014

CLINICAL DATA: Abnormal LFTs

EXAM:
US ABDOMEN LIMITED - RIGHT UPPER QUADRANT

[Series 1: us abdomen limited · 0.16mm/px · 14 of 57 slices shown]
[im 1/57]
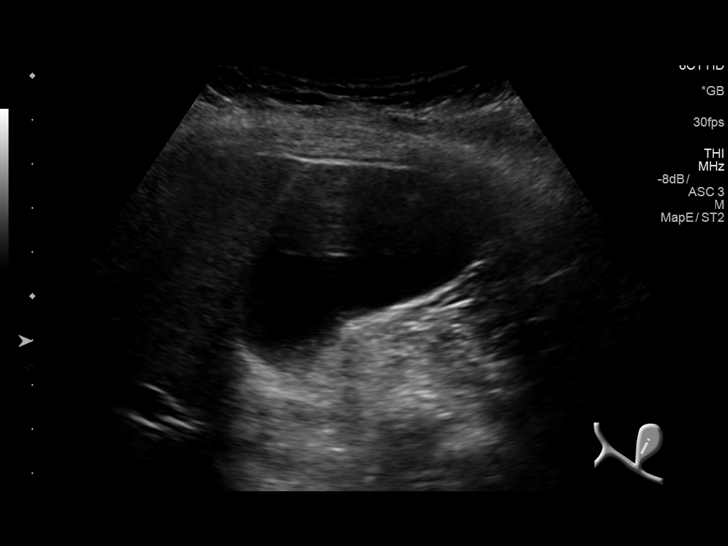
[im 5/57]
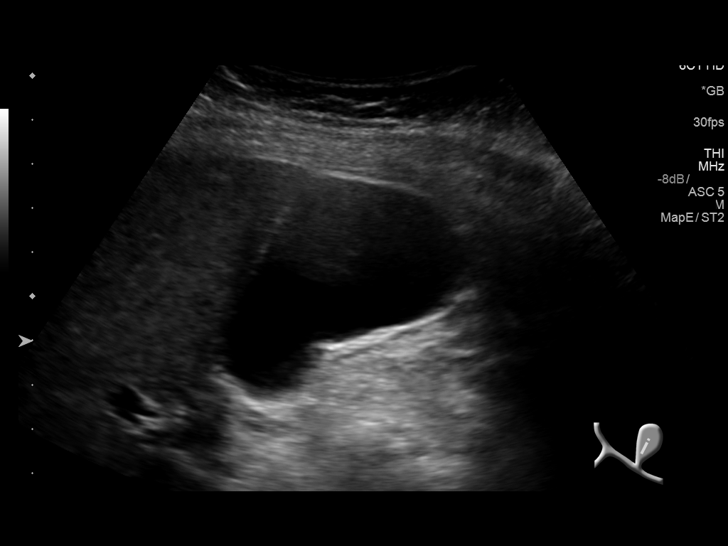
[im 10/57]
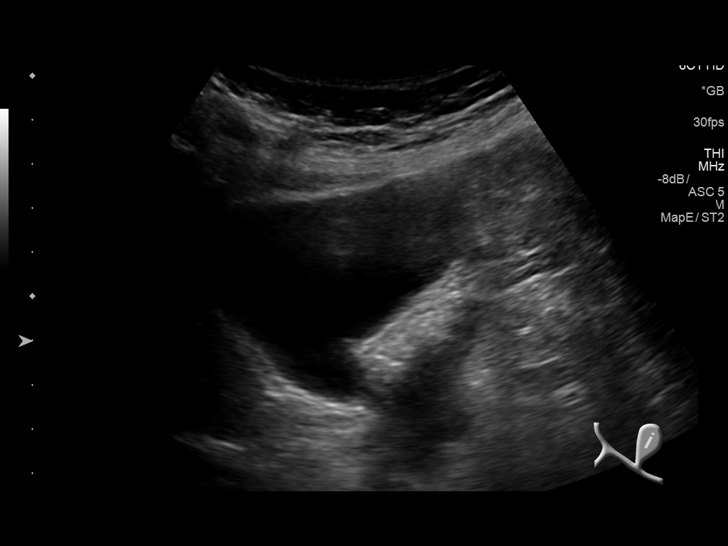
[im 15/57]
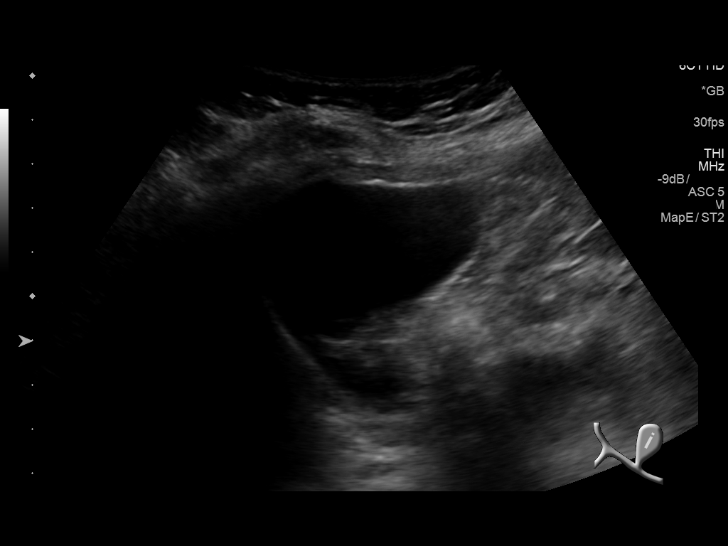
[im 19/57]
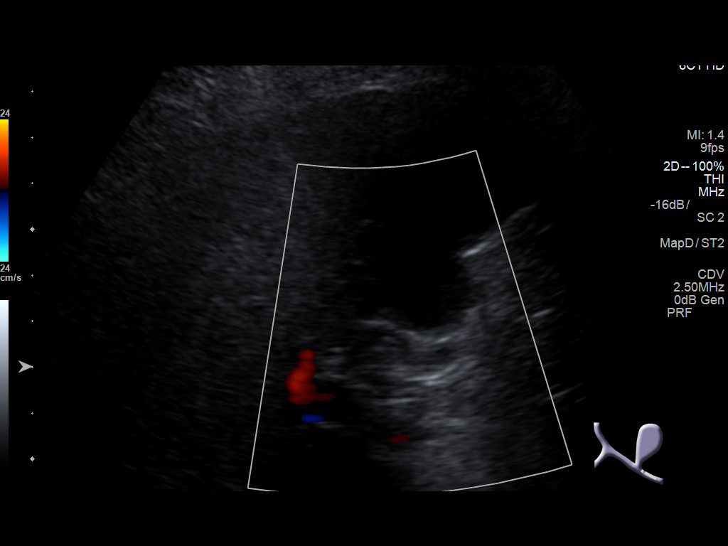
[im 22/57]
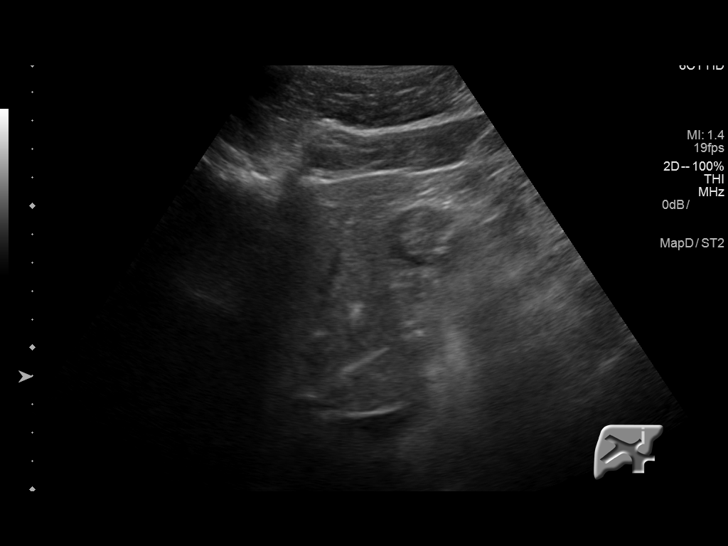
[im 26/57]
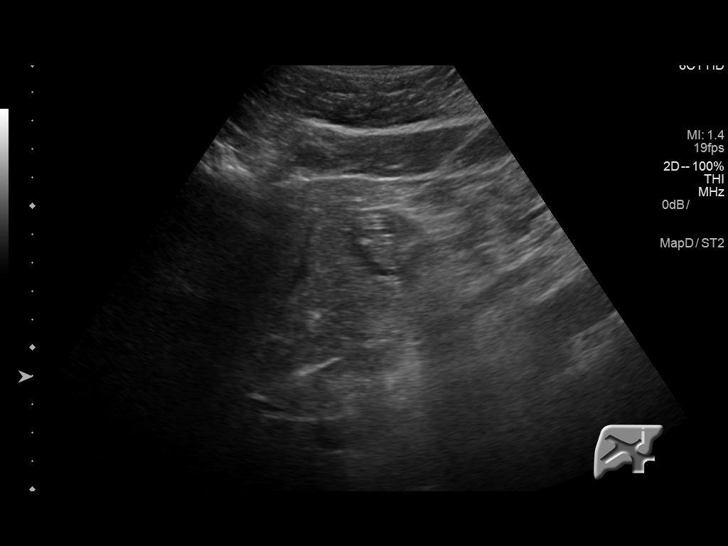
[im 31/57]
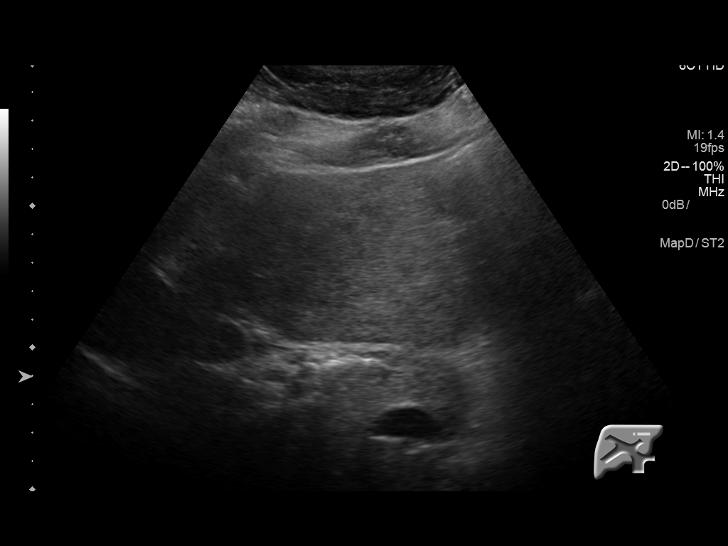
[im 36/57]
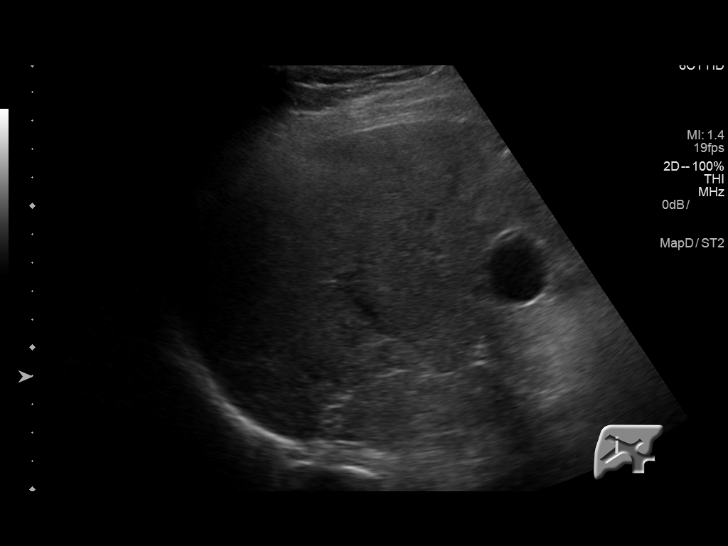
[im 38/57]
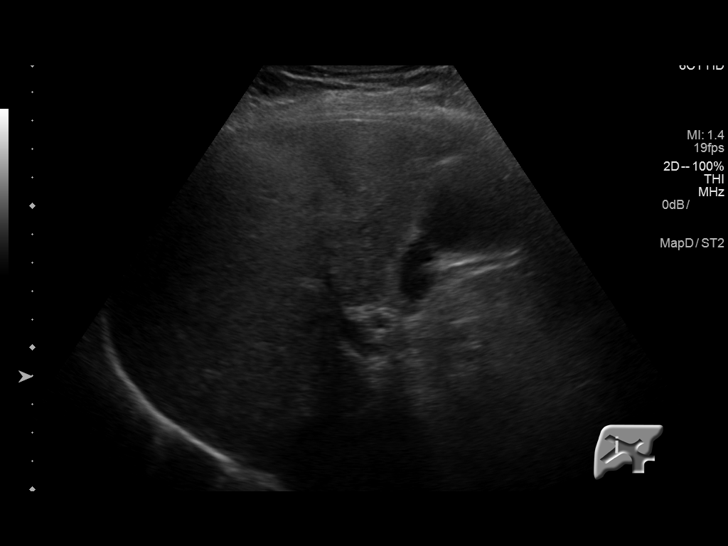
[im 43/57]
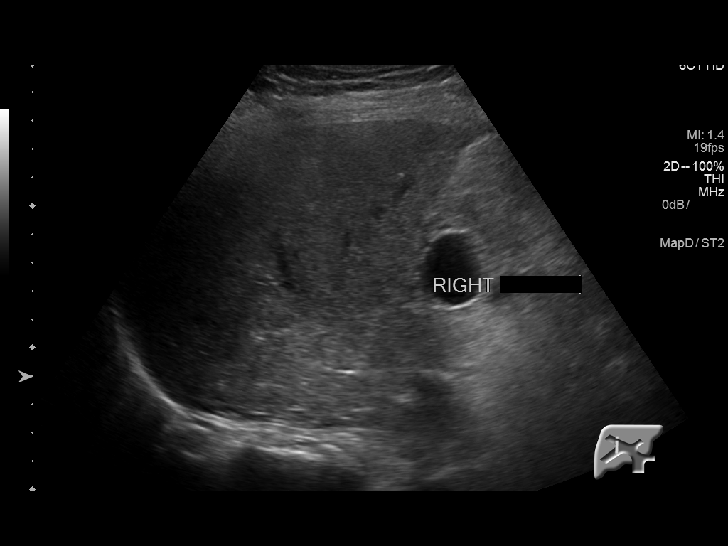
[im 47/57]
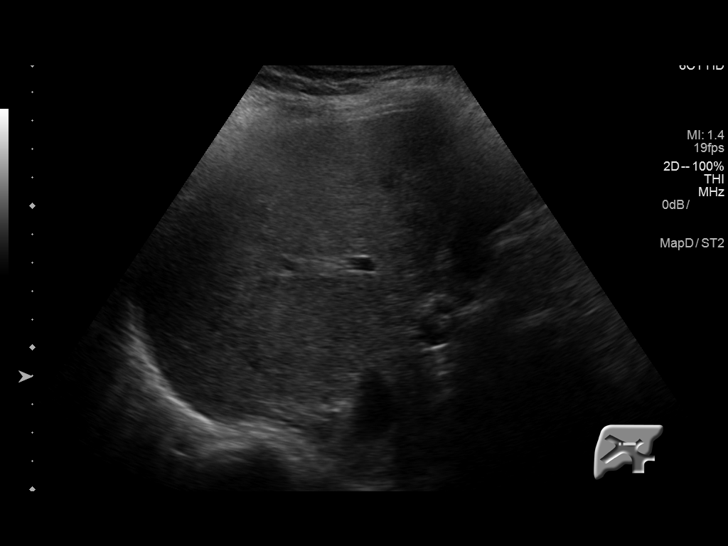
[im 52/57]
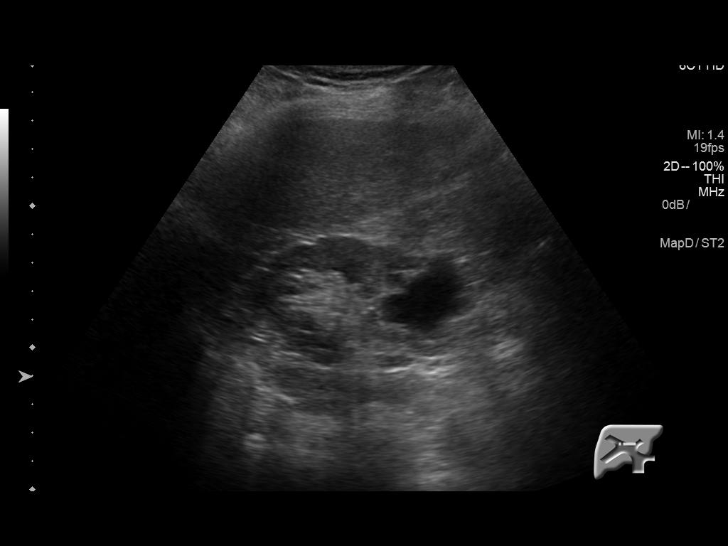
[im 57/57]
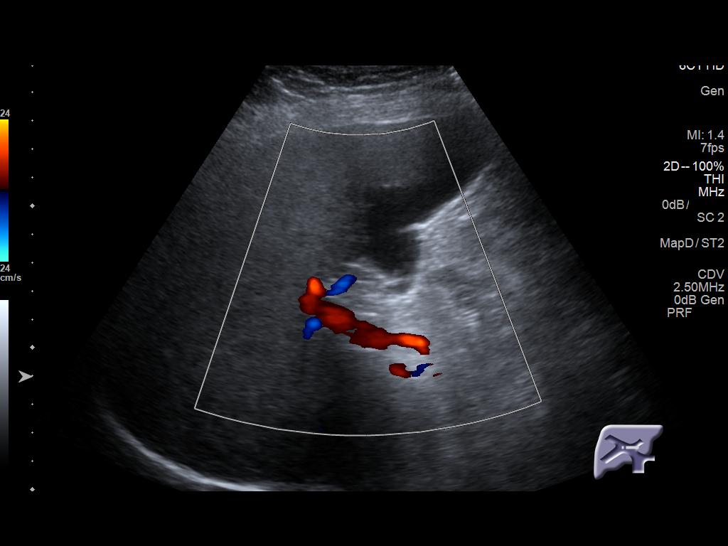

[14 of 25 positions shown; findings below may reference images not displayed]

FINDINGS: Gallbladder:

No gallstones, gallbladder wall thickening, or pericholecystic
fluid. Negative sonographic Murphy's sign.

Common bile duct:

Diameter: 3 mm

Liver:

Hyperechoic hepatic parenchyma.  No focal hepatic lesion is seen.
IMPRESSION: Hyperechoic hepatic parenchyma, nonspecific but possibly reflecting
hepatic steatosis. No focal hepatic lesion is seen.

## 2017-06-17 DIAGNOSIS — H903 Sensorineural hearing loss, bilateral: Secondary | ICD-10-CM | POA: Diagnosis not present

## 2017-07-02 DIAGNOSIS — E782 Mixed hyperlipidemia: Secondary | ICD-10-CM | POA: Diagnosis not present

## 2017-07-02 DIAGNOSIS — D509 Iron deficiency anemia, unspecified: Secondary | ICD-10-CM | POA: Diagnosis not present

## 2017-07-06 DIAGNOSIS — E782 Mixed hyperlipidemia: Secondary | ICD-10-CM | POA: Diagnosis not present

## 2017-07-06 DIAGNOSIS — Z6830 Body mass index (BMI) 30.0-30.9, adult: Secondary | ICD-10-CM | POA: Diagnosis not present

## 2017-07-06 DIAGNOSIS — R030 Elevated blood-pressure reading, without diagnosis of hypertension: Secondary | ICD-10-CM | POA: Diagnosis not present

## 2017-07-06 DIAGNOSIS — R945 Abnormal results of liver function studies: Secondary | ICD-10-CM | POA: Diagnosis not present

## 2017-07-06 DIAGNOSIS — D509 Iron deficiency anemia, unspecified: Secondary | ICD-10-CM | POA: Diagnosis not present

## 2017-09-07 DIAGNOSIS — Z1231 Encounter for screening mammogram for malignant neoplasm of breast: Secondary | ICD-10-CM | POA: Diagnosis not present

## 2017-10-05 DIAGNOSIS — R69 Illness, unspecified: Secondary | ICD-10-CM | POA: Diagnosis not present

## 2018-01-11 DIAGNOSIS — E119 Type 2 diabetes mellitus without complications: Secondary | ICD-10-CM | POA: Diagnosis not present

## 2018-01-11 DIAGNOSIS — E782 Mixed hyperlipidemia: Secondary | ICD-10-CM | POA: Diagnosis not present

## 2018-01-14 DIAGNOSIS — R7301 Impaired fasting glucose: Secondary | ICD-10-CM | POA: Diagnosis not present

## 2018-01-14 DIAGNOSIS — M25552 Pain in left hip: Secondary | ICD-10-CM | POA: Diagnosis not present

## 2018-01-14 DIAGNOSIS — Z6830 Body mass index (BMI) 30.0-30.9, adult: Secondary | ICD-10-CM | POA: Diagnosis not present

## 2018-01-14 DIAGNOSIS — K219 Gastro-esophageal reflux disease without esophagitis: Secondary | ICD-10-CM | POA: Diagnosis not present

## 2018-01-14 DIAGNOSIS — E782 Mixed hyperlipidemia: Secondary | ICD-10-CM | POA: Diagnosis not present

## 2018-01-14 DIAGNOSIS — R945 Abnormal results of liver function studies: Secondary | ICD-10-CM | POA: Diagnosis not present

## 2018-01-14 DIAGNOSIS — D509 Iron deficiency anemia, unspecified: Secondary | ICD-10-CM | POA: Diagnosis not present

## 2018-01-14 DIAGNOSIS — R5383 Other fatigue: Secondary | ICD-10-CM | POA: Diagnosis not present

## 2018-03-05 DIAGNOSIS — Z8249 Family history of ischemic heart disease and other diseases of the circulatory system: Secondary | ICD-10-CM | POA: Diagnosis not present

## 2018-03-05 DIAGNOSIS — Z803 Family history of malignant neoplasm of breast: Secondary | ICD-10-CM | POA: Diagnosis not present

## 2018-03-05 DIAGNOSIS — Z7982 Long term (current) use of aspirin: Secondary | ICD-10-CM | POA: Diagnosis not present

## 2018-03-05 DIAGNOSIS — G8929 Other chronic pain: Secondary | ICD-10-CM | POA: Diagnosis not present

## 2018-03-05 DIAGNOSIS — Z809 Family history of malignant neoplasm, unspecified: Secondary | ICD-10-CM | POA: Diagnosis not present

## 2018-03-05 DIAGNOSIS — J309 Allergic rhinitis, unspecified: Secondary | ICD-10-CM | POA: Diagnosis not present

## 2018-03-05 DIAGNOSIS — K219 Gastro-esophageal reflux disease without esophagitis: Secondary | ICD-10-CM | POA: Diagnosis not present

## 2018-03-05 DIAGNOSIS — M1990 Unspecified osteoarthritis, unspecified site: Secondary | ICD-10-CM | POA: Diagnosis not present

## 2018-03-05 DIAGNOSIS — Z791 Long term (current) use of non-steroidal anti-inflammatories (NSAID): Secondary | ICD-10-CM | POA: Diagnosis not present

## 2018-03-05 DIAGNOSIS — E785 Hyperlipidemia, unspecified: Secondary | ICD-10-CM | POA: Diagnosis not present

## 2018-04-26 DIAGNOSIS — Z6830 Body mass index (BMI) 30.0-30.9, adult: Secondary | ICD-10-CM | POA: Diagnosis not present

## 2018-04-26 DIAGNOSIS — K5792 Diverticulitis of intestine, part unspecified, without perforation or abscess without bleeding: Secondary | ICD-10-CM | POA: Diagnosis not present

## 2018-04-26 DIAGNOSIS — R69 Illness, unspecified: Secondary | ICD-10-CM | POA: Diagnosis not present

## 2018-04-26 DIAGNOSIS — R109 Unspecified abdominal pain: Secondary | ICD-10-CM | POA: Diagnosis not present

## 2018-05-25 DIAGNOSIS — E782 Mixed hyperlipidemia: Secondary | ICD-10-CM | POA: Diagnosis not present

## 2018-05-25 DIAGNOSIS — R030 Elevated blood-pressure reading, without diagnosis of hypertension: Secondary | ICD-10-CM | POA: Diagnosis not present

## 2018-05-25 DIAGNOSIS — Z6830 Body mass index (BMI) 30.0-30.9, adult: Secondary | ICD-10-CM | POA: Diagnosis not present

## 2018-05-25 DIAGNOSIS — R945 Abnormal results of liver function studies: Secondary | ICD-10-CM | POA: Diagnosis not present

## 2018-05-25 DIAGNOSIS — D509 Iron deficiency anemia, unspecified: Secondary | ICD-10-CM | POA: Diagnosis not present

## 2018-05-26 DIAGNOSIS — M25552 Pain in left hip: Secondary | ICD-10-CM | POA: Diagnosis not present

## 2018-05-26 DIAGNOSIS — R7301 Impaired fasting glucose: Secondary | ICD-10-CM | POA: Diagnosis not present

## 2018-05-26 DIAGNOSIS — R0602 Shortness of breath: Secondary | ICD-10-CM | POA: Diagnosis not present

## 2018-05-26 DIAGNOSIS — R5383 Other fatigue: Secondary | ICD-10-CM | POA: Diagnosis not present

## 2018-05-26 DIAGNOSIS — K219 Gastro-esophageal reflux disease without esophagitis: Secondary | ICD-10-CM | POA: Diagnosis not present

## 2018-05-26 DIAGNOSIS — D509 Iron deficiency anemia, unspecified: Secondary | ICD-10-CM | POA: Diagnosis not present

## 2018-05-26 DIAGNOSIS — E782 Mixed hyperlipidemia: Secondary | ICD-10-CM | POA: Diagnosis not present

## 2018-05-26 DIAGNOSIS — R0600 Dyspnea, unspecified: Secondary | ICD-10-CM | POA: Diagnosis not present

## 2018-05-26 DIAGNOSIS — R945 Abnormal results of liver function studies: Secondary | ICD-10-CM | POA: Diagnosis not present

## 2018-06-17 DIAGNOSIS — L821 Other seborrheic keratosis: Secondary | ICD-10-CM | POA: Diagnosis not present

## 2018-06-17 DIAGNOSIS — D220 Melanocytic nevi of lip: Secondary | ICD-10-CM | POA: Diagnosis not present

## 2018-06-17 DIAGNOSIS — D2239 Melanocytic nevi of other parts of face: Secondary | ICD-10-CM | POA: Diagnosis not present

## 2018-06-17 DIAGNOSIS — D1801 Hemangioma of skin and subcutaneous tissue: Secondary | ICD-10-CM | POA: Diagnosis not present

## 2018-06-17 DIAGNOSIS — L304 Erythema intertrigo: Secondary | ICD-10-CM | POA: Diagnosis not present

## 2018-09-17 DIAGNOSIS — Z1231 Encounter for screening mammogram for malignant neoplasm of breast: Secondary | ICD-10-CM | POA: Diagnosis not present

## 2018-11-03 DIAGNOSIS — R69 Illness, unspecified: Secondary | ICD-10-CM | POA: Diagnosis not present

## 2018-11-10 DIAGNOSIS — E782 Mixed hyperlipidemia: Secondary | ICD-10-CM | POA: Diagnosis not present

## 2018-11-10 DIAGNOSIS — R7301 Impaired fasting glucose: Secondary | ICD-10-CM | POA: Diagnosis not present

## 2018-11-10 DIAGNOSIS — D509 Iron deficiency anemia, unspecified: Secondary | ICD-10-CM | POA: Diagnosis not present

## 2018-11-10 DIAGNOSIS — R945 Abnormal results of liver function studies: Secondary | ICD-10-CM | POA: Diagnosis not present

## 2018-11-16 DIAGNOSIS — R7301 Impaired fasting glucose: Secondary | ICD-10-CM | POA: Diagnosis not present

## 2018-11-16 DIAGNOSIS — D509 Iron deficiency anemia, unspecified: Secondary | ICD-10-CM | POA: Diagnosis not present

## 2018-11-16 DIAGNOSIS — R040 Epistaxis: Secondary | ICD-10-CM | POA: Diagnosis not present

## 2018-11-16 DIAGNOSIS — M25552 Pain in left hip: Secondary | ICD-10-CM | POA: Diagnosis not present

## 2018-11-16 DIAGNOSIS — E782 Mixed hyperlipidemia: Secondary | ICD-10-CM | POA: Diagnosis not present

## 2018-11-16 DIAGNOSIS — E6609 Other obesity due to excess calories: Secondary | ICD-10-CM | POA: Diagnosis not present

## 2018-11-16 DIAGNOSIS — R945 Abnormal results of liver function studies: Secondary | ICD-10-CM | POA: Diagnosis not present

## 2018-11-16 DIAGNOSIS — K219 Gastro-esophageal reflux disease without esophagitis: Secondary | ICD-10-CM | POA: Diagnosis not present

## 2018-12-31 DIAGNOSIS — Z0000 Encounter for general adult medical examination without abnormal findings: Secondary | ICD-10-CM | POA: Diagnosis not present

## 2019-05-10 DIAGNOSIS — R69 Illness, unspecified: Secondary | ICD-10-CM | POA: Diagnosis not present

## 2019-05-26 DIAGNOSIS — R7301 Impaired fasting glucose: Secondary | ICD-10-CM | POA: Diagnosis not present

## 2019-05-26 DIAGNOSIS — D509 Iron deficiency anemia, unspecified: Secondary | ICD-10-CM | POA: Diagnosis not present

## 2019-05-26 DIAGNOSIS — E782 Mixed hyperlipidemia: Secondary | ICD-10-CM | POA: Diagnosis not present

## 2019-06-01 DIAGNOSIS — R7303 Prediabetes: Secondary | ICD-10-CM | POA: Diagnosis not present

## 2019-06-01 DIAGNOSIS — E6609 Other obesity due to excess calories: Secondary | ICD-10-CM | POA: Diagnosis not present

## 2019-06-01 DIAGNOSIS — E782 Mixed hyperlipidemia: Secondary | ICD-10-CM | POA: Diagnosis not present

## 2019-06-01 DIAGNOSIS — R945 Abnormal results of liver function studies: Secondary | ICD-10-CM | POA: Diagnosis not present

## 2019-06-01 DIAGNOSIS — K219 Gastro-esophageal reflux disease without esophagitis: Secondary | ICD-10-CM | POA: Diagnosis not present

## 2019-06-01 DIAGNOSIS — D509 Iron deficiency anemia, unspecified: Secondary | ICD-10-CM | POA: Diagnosis not present

## 2019-06-01 DIAGNOSIS — Z2821 Immunization not carried out because of patient refusal: Secondary | ICD-10-CM | POA: Diagnosis not present

## 2019-06-01 DIAGNOSIS — R0782 Intercostal pain: Secondary | ICD-10-CM | POA: Diagnosis not present

## 2019-06-01 DIAGNOSIS — R7301 Impaired fasting glucose: Secondary | ICD-10-CM | POA: Diagnosis not present

## 2019-06-03 ENCOUNTER — Other Ambulatory Visit (HOSPITAL_COMMUNITY): Payer: Self-pay | Admitting: Internal Medicine

## 2019-06-03 DIAGNOSIS — Z13820 Encounter for screening for osteoporosis: Secondary | ICD-10-CM

## 2019-06-13 ENCOUNTER — Other Ambulatory Visit: Payer: Self-pay

## 2019-06-13 ENCOUNTER — Ambulatory Visit (HOSPITAL_COMMUNITY)
Admission: RE | Admit: 2019-06-13 | Discharge: 2019-06-13 | Disposition: A | Discharge status: Home / Self Care | Payer: Medicare HMO | Source: Ambulatory Visit | Attending: Internal Medicine | Admitting: Internal Medicine

## 2019-06-13 DIAGNOSIS — Z13820 Encounter for screening for osteoporosis: Secondary | ICD-10-CM | POA: Diagnosis not present

## 2019-06-13 DIAGNOSIS — Z780 Asymptomatic menopausal state: Secondary | ICD-10-CM | POA: Insufficient documentation

## 2019-06-13 DIAGNOSIS — M85852 Other specified disorders of bone density and structure, left thigh: Secondary | ICD-10-CM | POA: Diagnosis not present

## 2019-06-13 DIAGNOSIS — M8580 Other specified disorders of bone density and structure, unspecified site: Secondary | ICD-10-CM | POA: Diagnosis not present

## 2019-06-15 DIAGNOSIS — E782 Mixed hyperlipidemia: Secondary | ICD-10-CM | POA: Diagnosis not present

## 2019-06-15 DIAGNOSIS — R7303 Prediabetes: Secondary | ICD-10-CM | POA: Diagnosis not present

## 2019-06-15 DIAGNOSIS — D509 Iron deficiency anemia, unspecified: Secondary | ICD-10-CM | POA: Diagnosis not present

## 2019-06-15 DIAGNOSIS — R945 Abnormal results of liver function studies: Secondary | ICD-10-CM | POA: Diagnosis not present

## 2019-06-15 DIAGNOSIS — K219 Gastro-esophageal reflux disease without esophagitis: Secondary | ICD-10-CM | POA: Diagnosis not present

## 2019-06-15 DIAGNOSIS — R7301 Impaired fasting glucose: Secondary | ICD-10-CM | POA: Diagnosis not present

## 2019-06-29 DIAGNOSIS — L723 Sebaceous cyst: Secondary | ICD-10-CM | POA: Diagnosis not present

## 2019-08-02 ENCOUNTER — Other Ambulatory Visit: Payer: Self-pay

## 2019-08-02 DIAGNOSIS — Z20822 Contact with and (suspected) exposure to covid-19: Secondary | ICD-10-CM

## 2019-08-04 LAB — NOVEL CORONAVIRUS, NAA: SARS-CoV-2, NAA: NOT DETECTED

## 2019-09-14 DIAGNOSIS — H43813 Vitreous degeneration, bilateral: Secondary | ICD-10-CM | POA: Diagnosis not present

## 2019-11-03 DIAGNOSIS — H01001 Unspecified blepharitis right upper eyelid: Secondary | ICD-10-CM | POA: Diagnosis not present

## 2019-11-03 DIAGNOSIS — H01004 Unspecified blepharitis left upper eyelid: Secondary | ICD-10-CM | POA: Diagnosis not present

## 2019-11-03 DIAGNOSIS — H26493 Other secondary cataract, bilateral: Secondary | ICD-10-CM | POA: Diagnosis not present

## 2019-11-03 DIAGNOSIS — Z961 Presence of intraocular lens: Secondary | ICD-10-CM | POA: Diagnosis not present

## 2019-11-03 DIAGNOSIS — H01002 Unspecified blepharitis right lower eyelid: Secondary | ICD-10-CM | POA: Diagnosis not present

## 2019-11-03 DIAGNOSIS — H01005 Unspecified blepharitis left lower eyelid: Secondary | ICD-10-CM | POA: Diagnosis not present

## 2019-11-30 DIAGNOSIS — K5792 Diverticulitis of intestine, part unspecified, without perforation or abscess without bleeding: Secondary | ICD-10-CM | POA: Diagnosis not present

## 2019-11-30 DIAGNOSIS — E6609 Other obesity due to excess calories: Secondary | ICD-10-CM | POA: Diagnosis not present

## 2019-11-30 DIAGNOSIS — R7303 Prediabetes: Secondary | ICD-10-CM | POA: Diagnosis not present

## 2019-11-30 DIAGNOSIS — M25552 Pain in left hip: Secondary | ICD-10-CM | POA: Diagnosis not present

## 2019-11-30 DIAGNOSIS — E782 Mixed hyperlipidemia: Secondary | ICD-10-CM | POA: Diagnosis not present

## 2019-11-30 DIAGNOSIS — R7301 Impaired fasting glucose: Secondary | ICD-10-CM | POA: Diagnosis not present

## 2019-11-30 DIAGNOSIS — D509 Iron deficiency anemia, unspecified: Secondary | ICD-10-CM | POA: Diagnosis not present

## 2019-11-30 DIAGNOSIS — R030 Elevated blood-pressure reading, without diagnosis of hypertension: Secondary | ICD-10-CM | POA: Diagnosis not present

## 2019-11-30 DIAGNOSIS — Z6830 Body mass index (BMI) 30.0-30.9, adult: Secondary | ICD-10-CM | POA: Diagnosis not present

## 2019-11-30 DIAGNOSIS — R040 Epistaxis: Secondary | ICD-10-CM | POA: Diagnosis not present

## 2019-11-30 DIAGNOSIS — L723 Sebaceous cyst: Secondary | ICD-10-CM | POA: Diagnosis not present

## 2019-11-30 DIAGNOSIS — Z2821 Immunization not carried out because of patient refusal: Secondary | ICD-10-CM | POA: Diagnosis not present

## 2019-11-30 DIAGNOSIS — Z0000 Encounter for general adult medical examination without abnormal findings: Secondary | ICD-10-CM | POA: Diagnosis not present

## 2019-11-30 DIAGNOSIS — K219 Gastro-esophageal reflux disease without esophagitis: Secondary | ICD-10-CM | POA: Diagnosis not present

## 2019-11-30 DIAGNOSIS — R945 Abnormal results of liver function studies: Secondary | ICD-10-CM | POA: Diagnosis not present

## 2019-12-05 DIAGNOSIS — Z2821 Immunization not carried out because of patient refusal: Secondary | ICD-10-CM | POA: Diagnosis not present

## 2019-12-05 DIAGNOSIS — E782 Mixed hyperlipidemia: Secondary | ICD-10-CM | POA: Diagnosis not present

## 2019-12-05 DIAGNOSIS — E6609 Other obesity due to excess calories: Secondary | ICD-10-CM | POA: Diagnosis not present

## 2019-12-05 DIAGNOSIS — R0782 Intercostal pain: Secondary | ICD-10-CM | POA: Diagnosis not present

## 2019-12-05 DIAGNOSIS — K219 Gastro-esophageal reflux disease without esophagitis: Secondary | ICD-10-CM | POA: Diagnosis not present

## 2019-12-05 DIAGNOSIS — R7303 Prediabetes: Secondary | ICD-10-CM | POA: Diagnosis not present

## 2019-12-05 DIAGNOSIS — D509 Iron deficiency anemia, unspecified: Secondary | ICD-10-CM | POA: Diagnosis not present

## 2019-12-05 DIAGNOSIS — R945 Abnormal results of liver function studies: Secondary | ICD-10-CM | POA: Diagnosis not present

## 2019-12-05 DIAGNOSIS — M8580 Other specified disorders of bone density and structure, unspecified site: Secondary | ICD-10-CM | POA: Diagnosis not present

## 2019-12-05 DIAGNOSIS — R7301 Impaired fasting glucose: Secondary | ICD-10-CM | POA: Diagnosis not present

## 2019-12-07 ENCOUNTER — Telehealth: Payer: Self-pay | Admitting: Cardiovascular Disease

## 2019-12-22 DIAGNOSIS — R945 Abnormal results of liver function studies: Secondary | ICD-10-CM | POA: Diagnosis not present

## 2019-12-22 DIAGNOSIS — D509 Iron deficiency anemia, unspecified: Secondary | ICD-10-CM | POA: Diagnosis not present

## 2019-12-22 DIAGNOSIS — R7303 Prediabetes: Secondary | ICD-10-CM | POA: Diagnosis not present

## 2019-12-22 DIAGNOSIS — E782 Mixed hyperlipidemia: Secondary | ICD-10-CM | POA: Diagnosis not present

## 2019-12-22 DIAGNOSIS — R7301 Impaired fasting glucose: Secondary | ICD-10-CM | POA: Diagnosis not present

## 2019-12-22 DIAGNOSIS — K219 Gastro-esophageal reflux disease without esophagitis: Secondary | ICD-10-CM | POA: Diagnosis not present

## 2019-12-26 DIAGNOSIS — R69 Illness, unspecified: Secondary | ICD-10-CM | POA: Diagnosis not present

## 2020-01-03 ENCOUNTER — Ambulatory Visit: Payer: Medicare HMO | Admitting: Cardiology

## 2020-01-03 ENCOUNTER — Other Ambulatory Visit: Payer: Self-pay

## 2020-01-03 ENCOUNTER — Encounter: Payer: Self-pay | Admitting: Cardiology

## 2020-01-03 VITALS — BP 144/80 | HR 70 | Temp 97.6°F | Ht 63.5 in | Wt 167.0 lb

## 2020-01-03 DIAGNOSIS — R079 Chest pain, unspecified: Secondary | ICD-10-CM | POA: Diagnosis not present

## 2020-01-03 DIAGNOSIS — R9431 Abnormal electrocardiogram [ECG] [EKG]: Secondary | ICD-10-CM

## 2020-01-03 DIAGNOSIS — R072 Precordial pain: Secondary | ICD-10-CM

## 2020-01-03 DIAGNOSIS — E782 Mixed hyperlipidemia: Secondary | ICD-10-CM

## 2020-01-03 NOTE — Patient Instructions (Signed)
Medication Instructions: Your physician recommends that you continue on your current medications as directed. Please refer to the Current Medication list given to you today.   Labwork: None today   Procedures/Testing: Your physician has requested that you have a lexiscan myoview. For further information please visit [email or web address]. Please follow instruction sheet, as given.    Follow-Up: We will call you with results  Any Additional Special Instructions Will Be Listed Below (If Applicable).     If you need a refill on your cardiac medications before your next appointment, please call your pharmacy.

## 2020-01-03 NOTE — Progress Notes (Signed)
Cardiology Office Note  Date: 01/03/2020   ID: Carrie Mayer, DOB May 03, 1946, MRN [id number]  PCP:  Celene Squibb, MD  Cardiologist:  Rozann Lesches, MD Electrophysiologist:  None   Chief Complaint  Patient presents with  . Chest Pain    History of Present Illness: Carrie Mayer is a 74 y.o. female referred for cardiology consultation by Dr. Nevada Crane for the evaluation of chest pain.  She reports infrequent and fairly sporadic episodes of chest discomfort over the years.  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  This lasted for about 5 minutes, she drank some water and the symptoms resolved.  She has had other episodes that occur at nighttime, she gets up from bed and takes an aspirin, walks around and the symptoms go away.  She does report reflux symptoms, but cannot say that these episodes are the same.  She does not report any exertional chest pain at this time.  She otherwise reports doing well at baseline.  She takes an aspirin daily and also Crestor for hyperlipidemia.  I personally reviewed her ECG which shows sinus rhythm with left anterior fascicular block, poor anterior R wave progression, rule out old anterior infarct pattern.  Past Medical History:  Diagnosis Date  . Anemia   . Arthritis   . Diverticulitis   . GERD (gastroesophageal reflux disease)   . Hyperlipidemia     Past Surgical History:  Procedure Laterality Date  . ABDOMINAL HYSTERECTOMY    . CATARACT EXTRACTION W/PHACO Left 02/01/2015   Procedure: CATARACT EXTRACTION PHACO AND INTRAOCULAR LENS PLACEMENT (IOC);  Surgeon: Tonny Branch, MD;  Location: [facility];  Service: Ophthalmology;  Laterality: Left;  CDE 6.48  . CATARACT EXTRACTION W/PHACO Right 03/01/2015   Procedure: CATARACT EXTRACTION PHACO AND INTRAOCULAR LENS PLACEMENT RIGHT EYE; CDE: 4.09;  Surgeon: Tonny Branch, MD;  Location:  [facility];  Service: Ophthalmology;  Laterality: Right;  . HEMORROIDECTOMY    . MYOMECTOMY  1976    Current Outpatient Medications  Medication Sig Dispense Refill  . aspirin EC 81 MG tablet Take 81 mg by mouth daily.    . meloxicam (MOBIC) 15 MG tablet Take 15 mg by mouth daily.    . Multiple Vitamin (MULTIVITAMIN WITH MINERALS) TABS tablet Take 1 tablet by mouth daily.    . rosuvastatin (CRESTOR) 5 MG tablet Take 5 mg by mouth daily.    Marland Kitchen UNABLE TO FIND Med Name: Derek Jack Bone Supplement for calcium    . UNABLE TO FIND Med Name: Derek Jack DigestZen softgels for heartburn    . UNABLE TO FIND Med Name:Doterra TriEase Softgels for allergy relief    . UNABLE TO FIND Med Name: Doterra PB Assist probiotic    . UNABLE TO FIND Med Name: Vinnie Langton for gut health    . UNABLE TO FIND Med Name: Derek Jack On Guard softgels immune support    . UNABLE TO FIND Med Name: Derek Jack Mitro2Max for energy & stamina     No current facility-administered medications for this visit.   Allergies:  Codeine   Social History: The patient  reports that she has never smoked. She has never used smokeless tobacco. She reports current alcohol use of about 1.0 standard drinks of alcohol per week. She reports that she does not use drugs.   Family History: The patient's family history includes Breast cancer in her mother.  ROS:   Hearing loss.  No palpitations or syncope.  Physical Exam: VS:  BP (!) 144/80   Pulse 70   Temp 97.6 F (36.4 C)   Ht 5' 3.5" (1.613 m)   Wt 167 lb (75.8 kg)   SpO2 93%   BMI 29.12 kg/m , BMI Body mass index is 29.12 kg/m.  Wt Readings from Last 3 Encounters:  01/03/20 167 lb (75.8 kg)  08/28/16 173 lb 9.6 oz (78.7 kg)  08/23/16 170 lb (77.1 kg)    General: Elderly woman, appears comfortable at rest. HEENT: Conjunctiva and lids normal, wearing a mask. Neck: Supple, no elevated JVP or carotid bruits, no thyromegaly. Lungs: Clear to auscultation, nonlabored breathing at  rest. Cardiac: Regular rate and rhythm, no S3 or significant systolic murmur, no pericardial rub. Abdomen: Soft, bowel sounds present. Extremities: No pitting edema, distal pulses 2+. Skin: Warm and dry. Musculoskeletal: No kyphosis. Neuropsychiatric: Alert and oriented x3, affect grossly appropriate.  ECG:  An ECG dated 03/17/2016 was personally reviewed today and demonstrated:  Sinus rhythm with left anterior fascicular block, decreased R wave progression.  Recent Labwork:  March 2021: Hemoglobin 14.3, platelets 270, BUN 17, creatinine 0.68, potassium 4.4, AST 21, ALT 35, cholesterol 185, triglycerides 183, HDL 52, LDL 101, hemoglobin A1c 5.9%  Other Studies Reviewed Today:  No prior cardiac testing for review today.  Assessment and Plan:  1.  Recurrent and fairly sporadic episodes of chest discomfort with atypical features as described above.  ECG at baseline is abnormal, she does have hyperlipidemia for which she takes Crestor.  Blood pressure elevated today but no standing history of hypertension.  She has not undergone any prior ischemic testing.  We will proceed with a Lexiscan Myoview for evaluation.  2.  Mixed hyperlipidemia, currently on Crestor.  Recent LDL 101.  Medication Adjustments/Labs and Tests Ordered: Current medicines are reviewed at length with the patient today.  Concerns regarding medicines are outlined above.   Tests Ordered: Orders Placed This Encounter  Procedures  . NM Myocar Multi W/Spect W/Wall Motion / EF  . EKG 12-Lead    Medication Changes: No orders of the defined types were placed in this encounter.   Disposition:  Follow up test results.  Signed, Satira Sark, MD, [facility] 01/03/2020 9:29 AM    [facility]. [address], [place], [place] [address] Phone: [phone]; Fax: [phone]

## 2020-01-18 ENCOUNTER — Ambulatory Visit (HOSPITAL_COMMUNITY)
Admission: RE | Admit: 2020-01-18 | Discharge: 2020-01-18 | Disposition: A | Discharge status: Home / Self Care | Payer: Medicare HMO | Source: Ambulatory Visit | Attending: Cardiology | Admitting: Cardiology

## 2020-01-18 ENCOUNTER — Encounter (HOSPITAL_COMMUNITY): Payer: Self-pay

## 2020-01-18 ENCOUNTER — Other Ambulatory Visit: Payer: Self-pay

## 2020-01-18 ENCOUNTER — Encounter (HOSPITAL_BASED_OUTPATIENT_CLINIC_OR_DEPARTMENT_OTHER)
Admission: RE | Admit: 2020-01-18 | Discharge: 2020-01-18 | Disposition: A | Discharge status: Home / Self Care | Payer: Medicare HMO | Source: Ambulatory Visit | Attending: Cardiology | Admitting: Cardiology

## 2020-01-18 DIAGNOSIS — R079 Chest pain, unspecified: Secondary | ICD-10-CM | POA: Insufficient documentation

## 2020-01-18 LAB — NM MYOCAR MULTI W/SPECT W/WALL MOTION / EF
LV dias vol: 74 mL (ref 46–106)
LV sys vol: 29 mL
Peak HR: 88 {beats}/min
RATE: 0.46
Rest HR: 64 {beats}/min
SDS: 1
SRS: 11
SSS: 12
TID: 1.13

## 2020-01-18 MED ORDER — SODIUM CHLORIDE FLUSH 0.9 % IV SOLN
INTRAVENOUS | Status: AC
  Administered 2020-01-18: 10 mL via INTRAVENOUS
  Filled 2020-01-18: qty 10

## 2020-01-18 MED ORDER — TECHNETIUM TC 99M TETROFOSMIN IV KIT
30.0000 | PACK | Freq: Once | INTRAVENOUS | Status: AC | PRN
  Administered 2020-01-18: 31.9 via INTRAVENOUS

## 2020-01-18 MED ORDER — REGADENOSON 0.4 MG/5ML IV SOLN
INTRAVENOUS | Status: AC
  Administered 2020-01-18: 0.4 mg via INTRAVENOUS
  Filled 2020-01-18: qty 5

## 2020-01-18 MED ORDER — TECHNETIUM TC 99M TETROFOSMIN IV KIT
10.0000 | PACK | Freq: Once | INTRAVENOUS | Status: AC | PRN
  Administered 2020-01-18: 10.5 via INTRAVENOUS

## 2020-01-19 DIAGNOSIS — K219 Gastro-esophageal reflux disease without esophagitis: Secondary | ICD-10-CM | POA: Diagnosis not present

## 2020-01-19 DIAGNOSIS — R7303 Prediabetes: Secondary | ICD-10-CM | POA: Diagnosis not present

## 2020-01-19 DIAGNOSIS — E782 Mixed hyperlipidemia: Secondary | ICD-10-CM | POA: Diagnosis not present

## 2020-01-19 DIAGNOSIS — R7301 Impaired fasting glucose: Secondary | ICD-10-CM | POA: Diagnosis not present

## 2020-01-19 DIAGNOSIS — D509 Iron deficiency anemia, unspecified: Secondary | ICD-10-CM | POA: Diagnosis not present

## 2020-01-19 DIAGNOSIS — R945 Abnormal results of liver function studies: Secondary | ICD-10-CM | POA: Diagnosis not present

## 2020-03-16 DIAGNOSIS — E782 Mixed hyperlipidemia: Secondary | ICD-10-CM | POA: Diagnosis not present

## 2020-03-16 DIAGNOSIS — R945 Abnormal results of liver function studies: Secondary | ICD-10-CM | POA: Diagnosis not present

## 2020-03-16 DIAGNOSIS — R7303 Prediabetes: Secondary | ICD-10-CM | POA: Diagnosis not present

## 2020-03-16 DIAGNOSIS — R7301 Impaired fasting glucose: Secondary | ICD-10-CM | POA: Diagnosis not present

## 2020-03-16 DIAGNOSIS — D509 Iron deficiency anemia, unspecified: Secondary | ICD-10-CM | POA: Diagnosis not present

## 2020-03-16 DIAGNOSIS — K219 Gastro-esophageal reflux disease without esophagitis: Secondary | ICD-10-CM | POA: Diagnosis not present

## 2020-04-11 DIAGNOSIS — D509 Iron deficiency anemia, unspecified: Secondary | ICD-10-CM | POA: Diagnosis not present

## 2020-04-11 DIAGNOSIS — R945 Abnormal results of liver function studies: Secondary | ICD-10-CM | POA: Diagnosis not present

## 2020-04-11 DIAGNOSIS — R7301 Impaired fasting glucose: Secondary | ICD-10-CM | POA: Diagnosis not present

## 2020-04-11 DIAGNOSIS — E782 Mixed hyperlipidemia: Secondary | ICD-10-CM | POA: Diagnosis not present

## 2020-04-11 DIAGNOSIS — K219 Gastro-esophageal reflux disease without esophagitis: Secondary | ICD-10-CM | POA: Diagnosis not present

## 2020-04-11 DIAGNOSIS — R7303 Prediabetes: Secondary | ICD-10-CM | POA: Diagnosis not present

## 2020-04-27 DIAGNOSIS — Z0100 Encounter for examination of eyes and vision without abnormal findings: Secondary | ICD-10-CM | POA: Diagnosis not present

## 2020-05-15 DIAGNOSIS — R7301 Impaired fasting glucose: Secondary | ICD-10-CM | POA: Diagnosis not present

## 2020-05-15 DIAGNOSIS — R7303 Prediabetes: Secondary | ICD-10-CM | POA: Diagnosis not present

## 2020-05-15 DIAGNOSIS — D509 Iron deficiency anemia, unspecified: Secondary | ICD-10-CM | POA: Diagnosis not present

## 2020-05-15 DIAGNOSIS — R945 Abnormal results of liver function studies: Secondary | ICD-10-CM | POA: Diagnosis not present

## 2020-05-15 DIAGNOSIS — E782 Mixed hyperlipidemia: Secondary | ICD-10-CM | POA: Diagnosis not present

## 2020-05-15 DIAGNOSIS — K219 Gastro-esophageal reflux disease without esophagitis: Secondary | ICD-10-CM | POA: Diagnosis not present

## 2020-05-31 DIAGNOSIS — R109 Unspecified abdominal pain: Secondary | ICD-10-CM | POA: Diagnosis not present

## 2020-05-31 DIAGNOSIS — K5792 Diverticulitis of intestine, part unspecified, without perforation or abscess without bleeding: Secondary | ICD-10-CM | POA: Diagnosis not present

## 2020-05-31 DIAGNOSIS — Z2821 Immunization not carried out because of patient refusal: Secondary | ICD-10-CM | POA: Diagnosis not present

## 2020-05-31 DIAGNOSIS — Z6830 Body mass index (BMI) 30.0-30.9, adult: Secondary | ICD-10-CM | POA: Diagnosis not present

## 2020-05-31 DIAGNOSIS — L723 Sebaceous cyst: Secondary | ICD-10-CM | POA: Diagnosis not present

## 2020-05-31 DIAGNOSIS — R945 Abnormal results of liver function studies: Secondary | ICD-10-CM | POA: Diagnosis not present

## 2020-05-31 DIAGNOSIS — R7303 Prediabetes: Secondary | ICD-10-CM | POA: Diagnosis not present

## 2020-05-31 DIAGNOSIS — R040 Epistaxis: Secondary | ICD-10-CM | POA: Diagnosis not present

## 2020-05-31 DIAGNOSIS — E782 Mixed hyperlipidemia: Secondary | ICD-10-CM | POA: Diagnosis not present

## 2020-05-31 DIAGNOSIS — E6609 Other obesity due to excess calories: Secondary | ICD-10-CM | POA: Diagnosis not present

## 2020-05-31 DIAGNOSIS — Z712 Person consulting for explanation of examination or test findings: Secondary | ICD-10-CM | POA: Diagnosis not present

## 2020-06-05 DIAGNOSIS — M8580 Other specified disorders of bone density and structure, unspecified site: Secondary | ICD-10-CM | POA: Diagnosis not present

## 2020-06-05 DIAGNOSIS — E6609 Other obesity due to excess calories: Secondary | ICD-10-CM | POA: Diagnosis not present

## 2020-06-05 DIAGNOSIS — E782 Mixed hyperlipidemia: Secondary | ICD-10-CM | POA: Diagnosis not present

## 2020-06-05 DIAGNOSIS — D509 Iron deficiency anemia, unspecified: Secondary | ICD-10-CM | POA: Diagnosis not present

## 2020-06-05 DIAGNOSIS — R0902 Hypoxemia: Secondary | ICD-10-CM | POA: Diagnosis not present

## 2020-06-05 DIAGNOSIS — R7303 Prediabetes: Secondary | ICD-10-CM | POA: Diagnosis not present

## 2020-06-05 DIAGNOSIS — Z2821 Immunization not carried out because of patient refusal: Secondary | ICD-10-CM | POA: Diagnosis not present

## 2020-06-05 DIAGNOSIS — R945 Abnormal results of liver function studies: Secondary | ICD-10-CM | POA: Diagnosis not present

## 2020-06-05 DIAGNOSIS — K219 Gastro-esophageal reflux disease without esophagitis: Secondary | ICD-10-CM | POA: Diagnosis not present

## 2020-06-05 DIAGNOSIS — Z0001 Encounter for general adult medical examination with abnormal findings: Secondary | ICD-10-CM | POA: Diagnosis not present

## 2020-06-05 DIAGNOSIS — R7301 Impaired fasting glucose: Secondary | ICD-10-CM | POA: Diagnosis not present

## 2020-06-05 DIAGNOSIS — R0782 Intercostal pain: Secondary | ICD-10-CM | POA: Diagnosis not present

## 2020-06-05 DIAGNOSIS — E039 Hypothyroidism, unspecified: Secondary | ICD-10-CM | POA: Diagnosis not present

## 2020-06-25 DIAGNOSIS — K219 Gastro-esophageal reflux disease without esophagitis: Secondary | ICD-10-CM | POA: Diagnosis not present

## 2020-06-25 DIAGNOSIS — D509 Iron deficiency anemia, unspecified: Secondary | ICD-10-CM | POA: Diagnosis not present

## 2020-06-25 DIAGNOSIS — R7301 Impaired fasting glucose: Secondary | ICD-10-CM | POA: Diagnosis not present

## 2020-06-25 DIAGNOSIS — R7303 Prediabetes: Secondary | ICD-10-CM | POA: Diagnosis not present

## 2020-06-25 DIAGNOSIS — R945 Abnormal results of liver function studies: Secondary | ICD-10-CM | POA: Diagnosis not present

## 2020-06-25 DIAGNOSIS — E782 Mixed hyperlipidemia: Secondary | ICD-10-CM | POA: Diagnosis not present

## 2020-07-11 DIAGNOSIS — R69 Illness, unspecified: Secondary | ICD-10-CM | POA: Diagnosis not present

## 2020-07-12 DIAGNOSIS — E7849 Other hyperlipidemia: Secondary | ICD-10-CM | POA: Diagnosis not present

## 2020-07-12 DIAGNOSIS — E782 Mixed hyperlipidemia: Secondary | ICD-10-CM | POA: Diagnosis not present

## 2020-07-12 DIAGNOSIS — R945 Abnormal results of liver function studies: Secondary | ICD-10-CM | POA: Diagnosis not present

## 2020-07-12 DIAGNOSIS — R7303 Prediabetes: Secondary | ICD-10-CM | POA: Diagnosis not present

## 2020-07-12 DIAGNOSIS — K219 Gastro-esophageal reflux disease without esophagitis: Secondary | ICD-10-CM | POA: Diagnosis not present

## 2020-07-12 DIAGNOSIS — D509 Iron deficiency anemia, unspecified: Secondary | ICD-10-CM | POA: Diagnosis not present

## 2020-07-12 DIAGNOSIS — R7301 Impaired fasting glucose: Secondary | ICD-10-CM | POA: Diagnosis not present

## 2020-07-18 DIAGNOSIS — Z7722 Contact with and (suspected) exposure to environmental tobacco smoke (acute) (chronic): Secondary | ICD-10-CM | POA: Diagnosis not present

## 2020-07-18 DIAGNOSIS — Z803 Family history of malignant neoplasm of breast: Secondary | ICD-10-CM | POA: Diagnosis not present

## 2020-07-18 DIAGNOSIS — K219 Gastro-esophageal reflux disease without esophagitis: Secondary | ICD-10-CM | POA: Diagnosis not present

## 2020-07-18 DIAGNOSIS — R030 Elevated blood-pressure reading, without diagnosis of hypertension: Secondary | ICD-10-CM | POA: Diagnosis not present

## 2020-07-18 DIAGNOSIS — Z885 Allergy status to narcotic agent status: Secondary | ICD-10-CM | POA: Diagnosis not present

## 2020-07-18 DIAGNOSIS — E785 Hyperlipidemia, unspecified: Secondary | ICD-10-CM | POA: Diagnosis not present

## 2020-09-04 DIAGNOSIS — Z1231 Encounter for screening mammogram for malignant neoplasm of breast: Secondary | ICD-10-CM | POA: Diagnosis not present

## 2020-09-07 DIAGNOSIS — R7303 Prediabetes: Secondary | ICD-10-CM | POA: Diagnosis not present

## 2020-09-07 DIAGNOSIS — R945 Abnormal results of liver function studies: Secondary | ICD-10-CM | POA: Diagnosis not present

## 2020-09-07 DIAGNOSIS — R7301 Impaired fasting glucose: Secondary | ICD-10-CM | POA: Diagnosis not present

## 2020-09-07 DIAGNOSIS — E782 Mixed hyperlipidemia: Secondary | ICD-10-CM | POA: Diagnosis not present

## 2020-09-07 DIAGNOSIS — D509 Iron deficiency anemia, unspecified: Secondary | ICD-10-CM | POA: Diagnosis not present

## 2020-09-07 DIAGNOSIS — K219 Gastro-esophageal reflux disease without esophagitis: Secondary | ICD-10-CM | POA: Diagnosis not present

## 2020-09-12 DIAGNOSIS — R922 Inconclusive mammogram: Secondary | ICD-10-CM | POA: Diagnosis not present

## 2020-09-12 DIAGNOSIS — R928 Other abnormal and inconclusive findings on diagnostic imaging of breast: Secondary | ICD-10-CM | POA: Diagnosis not present

## 2020-09-12 DIAGNOSIS — N6002 Solitary cyst of left breast: Secondary | ICD-10-CM | POA: Diagnosis not present

## 2020-09-14 ENCOUNTER — Other Ambulatory Visit: Payer: Medicare HMO

## 2020-09-14 DIAGNOSIS — Z20822 Contact with and (suspected) exposure to covid-19: Secondary | ICD-10-CM | POA: Diagnosis not present

## 2020-09-18 LAB — NOVEL CORONAVIRUS, NAA: SARS-CoV-2, NAA: NOT DETECTED

## 2020-10-06 DIAGNOSIS — E782 Mixed hyperlipidemia: Secondary | ICD-10-CM | POA: Diagnosis not present

## 2020-10-06 DIAGNOSIS — K219 Gastro-esophageal reflux disease without esophagitis: Secondary | ICD-10-CM | POA: Diagnosis not present

## 2020-10-06 DIAGNOSIS — R7301 Impaired fasting glucose: Secondary | ICD-10-CM | POA: Diagnosis not present

## 2020-10-06 DIAGNOSIS — R7303 Prediabetes: Secondary | ICD-10-CM | POA: Diagnosis not present

## 2020-10-06 DIAGNOSIS — D509 Iron deficiency anemia, unspecified: Secondary | ICD-10-CM | POA: Diagnosis not present

## 2020-11-05 DIAGNOSIS — E782 Mixed hyperlipidemia: Secondary | ICD-10-CM | POA: Diagnosis not present

## 2020-11-05 DIAGNOSIS — D509 Iron deficiency anemia, unspecified: Secondary | ICD-10-CM | POA: Diagnosis not present

## 2020-11-05 DIAGNOSIS — K219 Gastro-esophageal reflux disease without esophagitis: Secondary | ICD-10-CM | POA: Diagnosis not present

## 2020-11-27 DIAGNOSIS — Z6830 Body mass index (BMI) 30.0-30.9, adult: Secondary | ICD-10-CM | POA: Diagnosis not present

## 2020-11-27 DIAGNOSIS — E6609 Other obesity due to excess calories: Secondary | ICD-10-CM | POA: Diagnosis not present

## 2020-11-27 DIAGNOSIS — R030 Elevated blood-pressure reading, without diagnosis of hypertension: Secondary | ICD-10-CM | POA: Diagnosis not present

## 2020-11-27 DIAGNOSIS — R7301 Impaired fasting glucose: Secondary | ICD-10-CM | POA: Diagnosis not present

## 2020-11-27 DIAGNOSIS — D509 Iron deficiency anemia, unspecified: Secondary | ICD-10-CM | POA: Diagnosis not present

## 2020-11-27 DIAGNOSIS — R7303 Prediabetes: Secondary | ICD-10-CM | POA: Diagnosis not present

## 2020-11-27 DIAGNOSIS — R945 Abnormal results of liver function studies: Secondary | ICD-10-CM | POA: Diagnosis not present

## 2020-11-27 DIAGNOSIS — M8580 Other specified disorders of bone density and structure, unspecified site: Secondary | ICD-10-CM | POA: Diagnosis not present

## 2020-11-27 DIAGNOSIS — E782 Mixed hyperlipidemia: Secondary | ICD-10-CM | POA: Diagnosis not present

## 2020-12-03 DIAGNOSIS — M8580 Other specified disorders of bone density and structure, unspecified site: Secondary | ICD-10-CM | POA: Diagnosis not present

## 2020-12-03 DIAGNOSIS — E782 Mixed hyperlipidemia: Secondary | ICD-10-CM | POA: Diagnosis not present

## 2020-12-03 DIAGNOSIS — R7303 Prediabetes: Secondary | ICD-10-CM | POA: Diagnosis not present

## 2020-12-03 DIAGNOSIS — R945 Abnormal results of liver function studies: Secondary | ICD-10-CM | POA: Diagnosis not present

## 2020-12-03 DIAGNOSIS — R0782 Intercostal pain: Secondary | ICD-10-CM | POA: Diagnosis not present

## 2020-12-03 DIAGNOSIS — R7301 Impaired fasting glucose: Secondary | ICD-10-CM | POA: Diagnosis not present

## 2020-12-03 DIAGNOSIS — D509 Iron deficiency anemia, unspecified: Secondary | ICD-10-CM | POA: Diagnosis not present

## 2020-12-03 DIAGNOSIS — R030 Elevated blood-pressure reading, without diagnosis of hypertension: Secondary | ICD-10-CM | POA: Diagnosis not present

## 2020-12-03 DIAGNOSIS — K219 Gastro-esophageal reflux disease without esophagitis: Secondary | ICD-10-CM | POA: Diagnosis not present

## 2020-12-03 DIAGNOSIS — Z2821 Immunization not carried out because of patient refusal: Secondary | ICD-10-CM | POA: Diagnosis not present

## 2020-12-05 DIAGNOSIS — E782 Mixed hyperlipidemia: Secondary | ICD-10-CM | POA: Diagnosis not present

## 2020-12-05 DIAGNOSIS — K219 Gastro-esophageal reflux disease without esophagitis: Secondary | ICD-10-CM | POA: Diagnosis not present

## 2020-12-05 DIAGNOSIS — D509 Iron deficiency anemia, unspecified: Secondary | ICD-10-CM | POA: Diagnosis not present

## 2020-12-11 NOTE — Telephone Encounter (Signed)
Error

## 2020-12-24 ENCOUNTER — Other Ambulatory Visit: Payer: Self-pay

## 2020-12-24 ENCOUNTER — Emergency Department (HOSPITAL_COMMUNITY)
Admission: EM | Admit: 2020-12-24 | Discharge: 2020-12-24 | Disposition: A | Discharge status: Home / Self Care | Payer: Medicare HMO | Attending: Emergency Medicine | Admitting: Emergency Medicine

## 2020-12-24 ENCOUNTER — Encounter (HOSPITAL_COMMUNITY): Payer: Self-pay

## 2020-12-24 DIAGNOSIS — R110 Nausea: Secondary | ICD-10-CM | POA: Diagnosis not present

## 2020-12-24 DIAGNOSIS — I10 Essential (primary) hypertension: Secondary | ICD-10-CM | POA: Diagnosis not present

## 2020-12-24 DIAGNOSIS — R42 Dizziness and giddiness: Secondary | ICD-10-CM | POA: Diagnosis not present

## 2020-12-24 DIAGNOSIS — Z7982 Long term (current) use of aspirin: Secondary | ICD-10-CM | POA: Diagnosis not present

## 2020-12-24 DIAGNOSIS — Z743 Need for continuous supervision: Secondary | ICD-10-CM | POA: Diagnosis not present

## 2020-12-24 LAB — CBC WITH DIFFERENTIAL/PLATELET
Abs Immature Granulocytes: 0.05 10*3/uL (ref 0.00–0.07)
Basophils Absolute: 0.1 10*3/uL (ref 0.0–0.1)
Basophils Relative: 1 %
Eosinophils Absolute: 0.2 10*3/uL (ref 0.0–0.5)
Eosinophils Relative: 2 %
HCT: 41.7 % (ref 36.0–46.0)
Hemoglobin: 14 g/dL (ref 12.0–15.0)
Immature Granulocytes: 1 %
Lymphocytes Relative: 17 %
Lymphs Abs: 1.4 10*3/uL (ref 0.7–4.0)
MCH: 30.2 pg (ref 26.0–34.0)
MCHC: 33.6 g/dL (ref 30.0–36.0)
MCV: 90.1 fL (ref 80.0–100.0)
Monocytes Absolute: 0.6 10*3/uL (ref 0.1–1.0)
Monocytes Relative: 7 %
Neutro Abs: 6.3 10*3/uL (ref 1.7–7.7)
Neutrophils Relative %: 72 %
Platelets: 253 10*3/uL (ref 150–400)
RBC: 4.63 MIL/uL (ref 3.87–5.11)
RDW: 13.8 % (ref 11.5–15.5)
WBC: 8.5 10*3/uL (ref 4.0–10.5)
nRBC: 0 % (ref 0.0–0.2)

## 2020-12-24 LAB — BASIC METABOLIC PANEL
Anion gap: 12 (ref 5–15)
BUN: 16 mg/dL (ref 8–23)
CO2: 25 mmol/L (ref 22–32)
Calcium: 9.4 mg/dL (ref 8.9–10.3)
Chloride: 100 mmol/L (ref 98–111)
Creatinine, Ser: 0.53 mg/dL (ref 0.44–1.00)
GFR, Estimated: >60 mL/min (ref >60)
Glucose, Bld: 120 mg/dL — ABNORMAL HIGH (ref 70–99)
Potassium: 3.4 mmol/L — ABNORMAL LOW (ref 3.5–5.1)
Sodium: 137 mmol/L (ref 135–145)

## 2020-12-24 MED ORDER — SODIUM CHLORIDE 0.9 % IV BOLUS
500.0000 mL | Freq: Once | INTRAVENOUS | Status: AC
  Administered 2020-12-24: 500 mL via INTRAVENOUS

## 2020-12-24 MED ORDER — ONDANSETRON HCL 4 MG/2ML IJ SOLN
4.0000 mg | Freq: Once | INTRAMUSCULAR | Status: AC
  Administered 2020-12-24: 4 mg via INTRAVENOUS
  Filled 2020-12-24: qty 2

## 2020-12-24 MED ORDER — MECLIZINE HCL 12.5 MG PO TABS
12.5000 mg | ORAL_TABLET | Freq: Once | ORAL | Status: AC
  Administered 2020-12-24: 12.5 mg via ORAL
  Filled 2020-12-24: qty 1

## 2020-12-24 NOTE — Discharge Instructions (Addendum)
You were seen in the emergency department for evaluation of dizziness.  You had lab work that was unremarkable.  He received some fluid medication with improvement in your symptoms.  Please contact your primary care doctor for close follow-up.  Return to the emergency department for any worsening or concerning symptoms

## 2020-12-24 NOTE — ED Triage Notes (Signed)
Pt was at dentist's office when she started feeling lightheaded and dizzy. States she did not have her dramamine with her, this feels like a previous vertigo episode. No LOC, had not received any medications at the dentist's office. A&Ox3, no neuro deficits noted.

## 2020-12-24 NOTE — ED Provider Notes (Signed)
[facility] EMERGENCY DEPARTMENT Provider Note   CSN: [id number] Arrival date & time: 12/24/20  1423     History Chief Complaint  Patient presents with  . Dizziness    Carrie Mayer is a 75 y.o. female.  She was at her dentist office and when they sat her up after getting her teeth cleaned she acutely felt dizziness.  She gets this very frequently.  She did not have her Dramamine with her.  She knew she was not abe to drive home so she had them call the ambulance.  She denies any headache focal weakness.  No blurry vision or double vision.  She feels dizzy when she moves her head.  She is nauseous.  The history is provided by the patient.  Dizziness Quality:  Room spinning Severity:  Moderate Onset quality:  Sudden Duration:  1 hour Timing:  Intermittent Progression:  Unchanged Chronicity:  Recurrent Context: standing up   Relieved by:  Nothing Worsened by:  Movement Ineffective treatments:  None tried Associated symptoms: nausea   Associated symptoms: no blood in stool, no chest pain, no diarrhea, no headaches, no shortness of breath, no vision changes and no vomiting        Past Medical History:  Diagnosis Date  . Anemia   . Arthritis   . Diverticulitis   . GERD (gastroesophageal reflux disease)   . Hyperlipidemia     Patient Active Problem List   Diagnosis Date Noted  . Pyogenic granuloma of skin 08/28/2016  . Nausea with vomiting 04/03/2014    Past Surgical History:  Procedure Laterality Date  . ABDOMINAL HYSTERECTOMY    . CATARACT EXTRACTION W/PHACO Left 02/01/2015   Procedure: CATARACT EXTRACTION PHACO AND INTRAOCULAR LENS PLACEMENT (IOC);  Surgeon: Tonny Branch, MD;  Location: [facility];  Service: Ophthalmology;  Laterality: Left;  CDE 6.48  . CATARACT EXTRACTION W/PHACO Right 03/01/2015   Procedure: CATARACT EXTRACTION PHACO AND INTRAOCULAR LENS PLACEMENT RIGHT EYE; CDE: 4.09;  Surgeon: Tonny Branch, MD;  Location: [facility];  Service: Ophthalmology;   Laterality: Right;  . HEMORROIDECTOMY    . MYOMECTOMY  1976     OB History    Gravida      Para      Term      Preterm      AB      Living  0     SAB      IAB      Ectopic      Multiple      Live Births              Family History  Problem Relation Age of Onset  . Breast cancer Mother     Social History   Tobacco Use  . Smoking status: Never Smoker  . Smokeless tobacco: Never Used  Substance Use Topics  . Alcohol use: Yes    Alcohol/week: 1.0 standard drink    Types: 1 Glasses of wine per week    Comment: Occasional  . Drug use: No    Home Medications Prior to Admission medications   Medication Sig Start Date End Date Taking? Authorizing Provider  aspirin EC 81 MG tablet Take 81 mg by mouth daily.    [provider]  meloxicam (MOBIC) 15 MG tablet Take 15 mg by mouth daily.    [provider]  Multiple Vitamin (MULTIVITAMIN WITH MINERALS) TABS tablet Take 1 tablet by mouth daily.    [provider]  rosuvastatin (CRESTOR) 5 MG tablet  Take 5 mg by mouth daily. 12/29/19   [provider]  UNABLE TO FIND Med Name: Salvadore Dom Supplement for calcium    [provider]  UNABLE TO FIND Med Name: Kerman Passey softgels for heartburn    [provider]  Mutual for allergy relief    [provider]  UNABLE TO FIND Med Name: Doterra PB Assist probiotic    [provider]  UNABLE TO FIND Med Name: Vinnie Langton for gut health    [provider]  Karen Kays TO FIND Med Name: Derek Jack On Guard softgels immune support    [provider]  UNABLE TO FIND Med Name: Vonna Kotyk for energy & stamina    [provider]    Allergies    Codeine  Review of Systems   Review of Systems  Constitutional: Negative for fever.  HENT: Negative for sore throat.   Eyes: Negative for visual disturbance.  Respiratory: Negative  for shortness of breath.   Cardiovascular: Negative for chest pain.  Gastrointestinal: Positive for nausea. Negative for abdominal pain, blood in stool, diarrhea and vomiting.  Genitourinary: Negative for dysuria.  Musculoskeletal: Negative for neck pain.  Skin: Negative for rash.  Neurological: Positive for dizziness. Negative for headaches.    Physical Exam Updated Vital Signs BP (!) 169/85 (BP Location: Right Arm)   Pulse 62   Temp 97.7 F (36.5 C) (Oral)   Resp 17   Ht 5\' 3"  (1.6 m)   Wt 74.8 kg   SpO2 100%   BMI 29.23 kg/m   Physical Exam Vitals and nursing note reviewed.  Constitutional:      General: She is not in acute distress.    Appearance: Normal appearance. She is well-developed.  HENT:     Head: Normocephalic and atraumatic.  Eyes:     Conjunctiva/sclera: Conjunctivae normal.     Comments: Horizontal nystagmus  Cardiovascular:     Rate and Rhythm: Normal rate and regular rhythm.     Heart sounds: No murmur heard.   Pulmonary:     Effort: Pulmonary effort is normal. No respiratory distress.     Breath sounds: Normal breath sounds.  Abdominal:     Palpations: Abdomen is soft.     Tenderness: There is no abdominal tenderness.  Musculoskeletal:        General: No deformity or signs of injury. Normal range of motion.     Cervical back: Neck supple.  Skin:    General: Skin is warm and dry.  Neurological:     General: No focal deficit present.     Mental Status: She is alert and oriented to person, place, and time.     Cranial Nerves: No cranial nerve deficit.     Sensory: No sensory deficit.     Motor: No weakness.     ED Results / Procedures / Treatments   Labs (all labs ordered are listed, but only abnormal results are displayed) Labs Reviewed  BASIC METABOLIC PANEL - Abnormal; Notable for the following components:      Result Value   Potassium 3.4 (*)    Glucose, Bld 120 (*)    All other components within normal limits  CBC WITH  DIFFERENTIAL/PLATELET    EKG EKG Interpretation  Date/Time:  Monday December 24 2020 14:47:26 EDT Ventricular Rate:  64 PR Interval:  192 QRS Duration: 106 QT Interval:  464 QTC Calculation: 479 R Axis:   -72 Text Interpretation: Sinus  rhythm Incomplete RBBB and LAFB Consider anterior infarct No significant change since prior 7/17 Confirmed by Aletta Edouard [phone]) on 12/24/2020 3:12:41 PM   Radiology No results found.  Procedures Procedures   Medications Ordered in ED Medications  ondansetron (ZOFRAN) injection 4 mg (has no administration in time range)  meclizine (ANTIVERT) tablet 12.5 mg (has no administration in time range)    ED Course  I have reviewed the triage vital signs and the nursing notes.  Pertinent labs & imaging results that were available during my care of the patient were reviewed by me and considered in my medical decision making (see chart for details).  Clinical Course as of 12/25/20 0949  Mon Dec 24, 2020  1800 Patient ambulated to use the bathroom and feels her dizziness is improved.  She is comfortable with discharge. [MB]    Clinical Course User Index [MB] Hayden Rasmussen, MD   MDM Rules/Calculators/A&P                         This patient complains of dizziness; this involves an extensive number of treatment Options and is a complaint that carries with it a high risk of complications and Morbidity. The differential includes vertigo, stroke, hypovolemia, anemia, metabolic derangement  I ordered, reviewed and interpreted labs, which included CBC with normal white count normal hemoglobin, chemistries fairly normal I ordered medication IV fluids oral meclizine  Previous records obtained and reviewed in epic, no recent admissions  After the interventions stated above, I reevaluated the patient and found patient to be symptomatically improved.  She is comfortable with discharge.  Return instructions discussed   Final Clinical Impression(s) /  ED Diagnoses Final diagnoses:  Vertigo    Rx / DC Orders ED Discharge Orders    None       Hayden Rasmussen, MD 12/25/20 [phone]

## 2021-03-07 DIAGNOSIS — E1165 Type 2 diabetes mellitus with hyperglycemia: Secondary | ICD-10-CM | POA: Diagnosis not present

## 2021-03-07 DIAGNOSIS — I10 Essential (primary) hypertension: Secondary | ICD-10-CM | POA: Diagnosis not present

## 2021-05-08 DIAGNOSIS — I10 Essential (primary) hypertension: Secondary | ICD-10-CM | POA: Diagnosis not present

## 2021-05-08 DIAGNOSIS — E1165 Type 2 diabetes mellitus with hyperglycemia: Secondary | ICD-10-CM | POA: Diagnosis not present

## 2021-05-29 DIAGNOSIS — E782 Mixed hyperlipidemia: Secondary | ICD-10-CM | POA: Diagnosis not present

## 2021-05-29 DIAGNOSIS — R7301 Impaired fasting glucose: Secondary | ICD-10-CM | POA: Diagnosis not present

## 2021-06-03 DIAGNOSIS — M85852 Other specified disorders of bone density and structure, left thigh: Secondary | ICD-10-CM | POA: Diagnosis not present

## 2021-06-03 DIAGNOSIS — R030 Elevated blood-pressure reading, without diagnosis of hypertension: Secondary | ICD-10-CM | POA: Diagnosis not present

## 2021-06-03 DIAGNOSIS — E782 Mixed hyperlipidemia: Secondary | ICD-10-CM | POA: Diagnosis not present

## 2021-06-03 DIAGNOSIS — K219 Gastro-esophageal reflux disease without esophagitis: Secondary | ICD-10-CM | POA: Diagnosis not present

## 2021-06-03 DIAGNOSIS — D509 Iron deficiency anemia, unspecified: Secondary | ICD-10-CM | POA: Diagnosis not present

## 2021-06-03 DIAGNOSIS — Z0001 Encounter for general adult medical examination with abnormal findings: Secondary | ICD-10-CM | POA: Diagnosis not present

## 2021-06-03 DIAGNOSIS — R945 Abnormal results of liver function studies: Secondary | ICD-10-CM | POA: Diagnosis not present

## 2021-06-03 DIAGNOSIS — R7301 Impaired fasting glucose: Secondary | ICD-10-CM | POA: Diagnosis not present

## 2021-06-03 DIAGNOSIS — E669 Obesity, unspecified: Secondary | ICD-10-CM | POA: Diagnosis not present

## 2021-06-05 DIAGNOSIS — Z008 Encounter for other general examination: Secondary | ICD-10-CM | POA: Diagnosis not present

## 2021-06-05 DIAGNOSIS — Z7982 Long term (current) use of aspirin: Secondary | ICD-10-CM | POA: Diagnosis not present

## 2021-06-05 DIAGNOSIS — Z8249 Family history of ischemic heart disease and other diseases of the circulatory system: Secondary | ICD-10-CM | POA: Diagnosis not present

## 2021-06-05 DIAGNOSIS — Z885 Allergy status to narcotic agent status: Secondary | ICD-10-CM | POA: Diagnosis not present

## 2021-06-05 DIAGNOSIS — M1990 Unspecified osteoarthritis, unspecified site: Secondary | ICD-10-CM | POA: Diagnosis not present

## 2021-06-05 DIAGNOSIS — E785 Hyperlipidemia, unspecified: Secondary | ICD-10-CM | POA: Diagnosis not present

## 2021-06-07 DIAGNOSIS — E1165 Type 2 diabetes mellitus with hyperglycemia: Secondary | ICD-10-CM | POA: Diagnosis not present

## 2021-06-07 DIAGNOSIS — I10 Essential (primary) hypertension: Secondary | ICD-10-CM | POA: Diagnosis not present

## 2021-07-08 DIAGNOSIS — E1165 Type 2 diabetes mellitus with hyperglycemia: Secondary | ICD-10-CM | POA: Diagnosis not present

## 2021-07-08 DIAGNOSIS — I10 Essential (primary) hypertension: Secondary | ICD-10-CM | POA: Diagnosis not present

## 2021-07-15 DIAGNOSIS — H903 Sensorineural hearing loss, bilateral: Secondary | ICD-10-CM | POA: Diagnosis not present

## 2021-08-07 DIAGNOSIS — I10 Essential (primary) hypertension: Secondary | ICD-10-CM | POA: Diagnosis not present

## 2021-08-07 DIAGNOSIS — E1165 Type 2 diabetes mellitus with hyperglycemia: Secondary | ICD-10-CM | POA: Diagnosis not present

## 2021-09-06 DIAGNOSIS — I10 Essential (primary) hypertension: Secondary | ICD-10-CM | POA: Diagnosis not present

## 2021-09-06 DIAGNOSIS — E1165 Type 2 diabetes mellitus with hyperglycemia: Secondary | ICD-10-CM | POA: Diagnosis not present

## 2021-11-11 DIAGNOSIS — Z1231 Encounter for screening mammogram for malignant neoplasm of breast: Secondary | ICD-10-CM | POA: Diagnosis not present

## 2021-11-29 DIAGNOSIS — E782 Mixed hyperlipidemia: Secondary | ICD-10-CM | POA: Diagnosis not present

## 2021-11-29 DIAGNOSIS — R7301 Impaired fasting glucose: Secondary | ICD-10-CM | POA: Diagnosis not present

## 2021-12-03 ENCOUNTER — Other Ambulatory Visit (HOSPITAL_COMMUNITY): Payer: Self-pay | Admitting: Internal Medicine

## 2021-12-03 DIAGNOSIS — M85852 Other specified disorders of bone density and structure, left thigh: Secondary | ICD-10-CM

## 2021-12-03 DIAGNOSIS — D509 Iron deficiency anemia, unspecified: Secondary | ICD-10-CM | POA: Diagnosis not present

## 2021-12-03 DIAGNOSIS — R030 Elevated blood-pressure reading, without diagnosis of hypertension: Secondary | ICD-10-CM | POA: Diagnosis not present

## 2021-12-03 DIAGNOSIS — R945 Abnormal results of liver function studies: Secondary | ICD-10-CM | POA: Diagnosis not present

## 2021-12-03 DIAGNOSIS — E669 Obesity, unspecified: Secondary | ICD-10-CM | POA: Diagnosis not present

## 2021-12-03 DIAGNOSIS — K219 Gastro-esophageal reflux disease without esophagitis: Secondary | ICD-10-CM | POA: Diagnosis not present

## 2021-12-03 DIAGNOSIS — R7301 Impaired fasting glucose: Secondary | ICD-10-CM | POA: Diagnosis not present

## 2021-12-03 DIAGNOSIS — E782 Mixed hyperlipidemia: Secondary | ICD-10-CM | POA: Diagnosis not present

## 2021-12-30 ENCOUNTER — Other Ambulatory Visit (HOSPITAL_COMMUNITY): Payer: Medicare HMO

## 2022-01-02 ENCOUNTER — Ambulatory Visit (HOSPITAL_COMMUNITY)
Admission: RE | Admit: 2022-01-02 | Discharge: 2022-01-02 | Disposition: A | Discharge status: Home / Self Care | Payer: Medicare HMO | Source: Ambulatory Visit | Attending: Internal Medicine | Admitting: Internal Medicine

## 2022-01-02 DIAGNOSIS — M85852 Other specified disorders of bone density and structure, left thigh: Secondary | ICD-10-CM | POA: Insufficient documentation

## 2022-01-02 DIAGNOSIS — Z780 Asymptomatic menopausal state: Secondary | ICD-10-CM | POA: Diagnosis not present

## 2022-01-02 DIAGNOSIS — M8589 Other specified disorders of bone density and structure, multiple sites: Secondary | ICD-10-CM | POA: Diagnosis not present

## 2022-02-12 DIAGNOSIS — L03011 Cellulitis of right finger: Secondary | ICD-10-CM | POA: Diagnosis not present

## 2022-04-16 ENCOUNTER — Other Ambulatory Visit (HOSPITAL_COMMUNITY): Payer: Self-pay | Admitting: Nurse Practitioner

## 2022-04-16 ENCOUNTER — Ambulatory Visit (HOSPITAL_COMMUNITY)
Admission: RE | Admit: 2022-04-16 | Discharge: 2022-04-16 | Disposition: A | Discharge status: Home / Self Care | Payer: Medicare HMO | Source: Ambulatory Visit | Attending: Nurse Practitioner | Admitting: Nurse Practitioner

## 2022-04-16 DIAGNOSIS — M25572 Pain in left ankle and joints of left foot: Secondary | ICD-10-CM | POA: Diagnosis not present

## 2022-04-16 DIAGNOSIS — M25472 Effusion, left ankle: Secondary | ICD-10-CM | POA: Diagnosis not present

## 2022-04-16 DIAGNOSIS — R600 Localized edema: Secondary | ICD-10-CM | POA: Diagnosis not present

## 2022-06-02 DIAGNOSIS — R7301 Impaired fasting glucose: Secondary | ICD-10-CM | POA: Diagnosis not present

## 2022-06-02 DIAGNOSIS — E782 Mixed hyperlipidemia: Secondary | ICD-10-CM | POA: Diagnosis not present

## 2022-06-05 DIAGNOSIS — M25472 Effusion, left ankle: Secondary | ICD-10-CM | POA: Diagnosis not present

## 2022-06-05 DIAGNOSIS — M7732 Calcaneal spur, left foot: Secondary | ICD-10-CM | POA: Diagnosis not present

## 2022-06-05 DIAGNOSIS — M85852 Other specified disorders of bone density and structure, left thigh: Secondary | ICD-10-CM | POA: Diagnosis not present

## 2022-06-05 DIAGNOSIS — R7301 Impaired fasting glucose: Secondary | ICD-10-CM | POA: Diagnosis not present

## 2022-06-05 DIAGNOSIS — R030 Elevated blood-pressure reading, without diagnosis of hypertension: Secondary | ICD-10-CM | POA: Diagnosis not present

## 2022-06-05 DIAGNOSIS — R945 Abnormal results of liver function studies: Secondary | ICD-10-CM | POA: Diagnosis not present

## 2022-06-05 DIAGNOSIS — E782 Mixed hyperlipidemia: Secondary | ICD-10-CM | POA: Diagnosis not present

## 2022-06-05 DIAGNOSIS — E669 Obesity, unspecified: Secondary | ICD-10-CM | POA: Diagnosis not present

## 2022-06-05 DIAGNOSIS — D509 Iron deficiency anemia, unspecified: Secondary | ICD-10-CM | POA: Diagnosis not present

## 2022-06-05 DIAGNOSIS — K219 Gastro-esophageal reflux disease without esophagitis: Secondary | ICD-10-CM | POA: Diagnosis not present

## 2022-06-05 DIAGNOSIS — Z0000 Encounter for general adult medical examination without abnormal findings: Secondary | ICD-10-CM | POA: Diagnosis not present

## 2022-06-12 DIAGNOSIS — M76822 Posterior tibial tendinitis, left leg: Secondary | ICD-10-CM | POA: Diagnosis not present

## 2022-06-12 DIAGNOSIS — M79672 Pain in left foot: Secondary | ICD-10-CM | POA: Diagnosis not present

## 2022-06-12 DIAGNOSIS — M722 Plantar fascial fibromatosis: Secondary | ICD-10-CM | POA: Diagnosis not present

## 2022-07-08 DIAGNOSIS — M76822 Posterior tibial tendinitis, left leg: Secondary | ICD-10-CM | POA: Diagnosis not present

## 2022-07-08 DIAGNOSIS — M722 Plantar fascial fibromatosis: Secondary | ICD-10-CM | POA: Diagnosis not present

## 2022-07-08 DIAGNOSIS — M79672 Pain in left foot: Secondary | ICD-10-CM | POA: Diagnosis not present

## 2022-07-16 ENCOUNTER — Emergency Department (HOSPITAL_COMMUNITY): Payer: Medicare HMO

## 2022-07-16 ENCOUNTER — Encounter (HOSPITAL_COMMUNITY): Payer: Self-pay

## 2022-07-16 ENCOUNTER — Other Ambulatory Visit: Payer: Self-pay

## 2022-07-16 ENCOUNTER — Emergency Department (HOSPITAL_COMMUNITY)
Admission: EM | Admit: 2022-07-16 | Discharge: 2022-07-16 | Disposition: A | Discharge status: Home / Self Care | Payer: Medicare HMO | Attending: Student | Admitting: Student

## 2022-07-16 DIAGNOSIS — S80212A Abrasion, left knee, initial encounter: Secondary | ICD-10-CM | POA: Diagnosis not present

## 2022-07-16 DIAGNOSIS — S80211A Abrasion, right knee, initial encounter: Secondary | ICD-10-CM | POA: Diagnosis not present

## 2022-07-16 DIAGNOSIS — S0033XA Contusion of nose, initial encounter: Secondary | ICD-10-CM | POA: Diagnosis not present

## 2022-07-16 DIAGNOSIS — W01198A Fall on same level from slipping, tripping and stumbling with subsequent striking against other object, initial encounter: Secondary | ICD-10-CM | POA: Insufficient documentation

## 2022-07-16 DIAGNOSIS — S0990XA Unspecified injury of head, initial encounter: Secondary | ICD-10-CM

## 2022-07-16 DIAGNOSIS — Z7982 Long term (current) use of aspirin: Secondary | ICD-10-CM | POA: Diagnosis not present

## 2022-07-16 DIAGNOSIS — Y92009 Unspecified place in unspecified non-institutional (private) residence as the place of occurrence of the external cause: Secondary | ICD-10-CM | POA: Insufficient documentation

## 2022-07-16 DIAGNOSIS — Z6828 Body mass index (BMI) 28.0-28.9, adult: Secondary | ICD-10-CM | POA: Diagnosis not present

## 2022-07-16 DIAGNOSIS — M47812 Spondylosis without myelopathy or radiculopathy, cervical region: Secondary | ICD-10-CM | POA: Diagnosis not present

## 2022-07-16 DIAGNOSIS — S0083XA Contusion of other part of head, initial encounter: Secondary | ICD-10-CM | POA: Insufficient documentation

## 2022-07-16 DIAGNOSIS — S0511XA Contusion of eyeball and orbital tissues, right eye, initial encounter: Secondary | ICD-10-CM | POA: Diagnosis not present

## 2022-07-16 DIAGNOSIS — Z043 Encounter for examination and observation following other accident: Secondary | ICD-10-CM | POA: Diagnosis not present

## 2022-07-16 DIAGNOSIS — W109XXA Fall (on) (from) unspecified stairs and steps, initial encounter: Secondary | ICD-10-CM | POA: Diagnosis not present

## 2022-07-16 DIAGNOSIS — E663 Overweight: Secondary | ICD-10-CM | POA: Diagnosis not present

## 2022-07-16 DIAGNOSIS — M25551 Pain in right hip: Secondary | ICD-10-CM | POA: Diagnosis not present

## 2022-07-16 DIAGNOSIS — M79644 Pain in right finger(s): Secondary | ICD-10-CM | POA: Diagnosis not present

## 2022-07-16 DIAGNOSIS — S0010XA Contusion of unspecified eyelid and periocular area, initial encounter: Secondary | ICD-10-CM | POA: Diagnosis not present

## 2022-07-16 DIAGNOSIS — I6381 Other cerebral infarction due to occlusion or stenosis of small artery: Secondary | ICD-10-CM | POA: Diagnosis not present

## 2022-07-16 NOTE — ED Notes (Signed)
Discharge instructions reviewed with patient. No questions or concerns to report. Patient discharged.

## 2022-07-16 NOTE — Discharge Instructions (Addendum)
You are seen today in the emergency department due to fall.  Your scans today were very reassuring, you have a bruise to your eye but there is no signs of deeper injury to your skull, brain, spine.  He can follow-up with your main doctor as needed.  Take Tylenol for pain.  If you develop any loss of vision, inability to move your eye or new symptoms you should return back to the ED for additional evaluation.

## 2022-07-16 NOTE — ED Provider Notes (Signed)
[facility] EMERGENCY DEPARTMENT Provider Note   CSN: [id number] Arrival date & time: 07/16/22  1927     History  No chief complaint on file.   Carrie Mayer is a 76 y.o. female.  The history is provided by the patient.     Patient presents due to fall.  Patient was ambulating in a friend's house, she tripped and fell forward hitting her face.  She denies losing consciousness.  She denies any vision changes, headache, use of blood thinners.  She has bruising around her right eye, also some pain with neck movement.  She also endorses pain to the right hip.  Home Medications Prior to Admission medications   Medication Sig Start Date End Date Taking? Authorizing Provider  aspirin EC 81 MG tablet Take 81 mg by mouth daily.    [provider]  meloxicam (MOBIC) 15 MG tablet Take 15 mg by mouth daily.    [provider]  Multiple Vitamin (MULTIVITAMIN WITH MINERALS) TABS tablet Take 1 tablet by mouth daily.    [provider]  rosuvastatin (CRESTOR) 5 MG tablet Take 5 mg by mouth daily. 12/29/19   [provider]  UNABLE TO FIND Med Name: Salvadore Dom Supplement for calcium    [provider]  UNABLE TO FIND Med Name: Kerman Passey softgels for heartburn    [provider]  Duncan for allergy relief    [provider]  UNABLE TO FIND Med Name: Doterra PB Assist probiotic    [provider]  UNABLE TO FIND Med Name: Vinnie Langton for gut health    [provider]  Karen Kays TO FIND Med Name: Derek Jack On Guard softgels immune support    [provider]  UNABLE TO FIND Med Name: Vonna Kotyk for energy & stamina    [provider]      Allergies    Codeine    Review of Systems   Review of Systems  Physical Exam Updated Vital Signs BP (!) 160/89 (BP Location: Right Arm)   Pulse 70   Temp 98.3 F (36.8 C) (Oral)   Resp 15   Ht  '5\' 3"'$  (1.6 m)   Wt 72.6 kg   SpO2 99%   BMI 28.34 kg/m  Physical Exam Vitals and nursing note reviewed. Exam conducted with a chaperone present.  Constitutional:      Appearance: Normal appearance.  HENT:     Head: Normocephalic.     Comments: Right eye Perry orbital ecchymosis.  No battle sign, no hemotympanum, no nasal crepitus.  Contusion on over the nasal bridge.  No septal hematoma Eyes:     General: No scleral icterus.       Right eye: No discharge.        Left eye: No discharge.     Extraocular Movements: Extraocular movements intact.     Pupils: Pupils are equal, round, and reactive to light.     Comments: No entrapment  Cardiovascular:     Rate and Rhythm: Normal rate and regular rhythm.     Pulses: Normal pulses.     Heart sounds: Normal heart sounds. No murmur heard.    No friction rub. No gallop.  Pulmonary:     Effort: Pulmonary effort is normal. No respiratory distress.     Breath sounds: Normal breath sounds.  Abdominal:     General: Abdomen is flat. Bowel sounds are normal. There is no distension.  Palpations: Abdomen is soft.     Tenderness: There is no abdominal tenderness.  Musculoskeletal:     Cervical back: Normal range of motion. Tenderness present.     Comments: Tender to palpation to the right hip.  Moving upper and lower extremities otherwise without difficulty.  Skin:    General: Skin is warm and dry.     Coloration: Skin is not jaundiced.  Neurological:     Mental Status: She is alert. Mental status is at baseline.     Coordination: Coordination normal.     ED Results / Procedures / Treatments   Labs (all labs ordered are listed, but only abnormal results are displayed) Labs Reviewed - No data to display  EKG None  Radiology CT Head Wo Contrast  Result Date: 07/16/2022 CLINICAL DATA:  Head trauma after fall; bruising to the left eye EXAM: CT HEAD WITHOUT CONTRAST CT MAXILLOFACIAL WITHOUT CONTRAST CT CERVICAL SPINE WITHOUT CONTRAST  TECHNIQUE: Multidetector CT imaging of the head, cervical spine, and maxillofacial structures were performed using the standard protocol without intravenous contrast. Multiplanar CT image reconstructions of the cervical spine and maxillofacial structures were also generated. RADIATION DOSE REDUCTION: This exam was performed according to the departmental dose-optimization program which includes automated exposure control, adjustment of the mA and/or kV according to patient size and/or use of iterative reconstruction technique. COMPARISON:  None available FINDINGS: CT HEAD FINDINGS Brain: No intracranial hemorrhage, mass effect, or evidence of acute infarct. No hydrocephalus. No extra-axial fluid collection. Generalized cerebral atrophy. Ill-defined hypoattenuation within the cerebral white matter is nonspecific but consistent with chronic small vessel ischemic disease. Chronic lacunar infarct left basal ganglia. Vascular: No hyperdense vessel. Intracranial arterial calcification. Skull: No fracture or focal lesion. Other: None. CT MAXILLOFACIAL FINDINGS Osseous: No fracture or mandibular dislocation. No destructive process. Orbits: Negative. No traumatic or inflammatory finding. Sinuses: Clear. Soft tissues: Soft tissue contusion about the right orbit. CT CERVICAL SPINE FINDINGS Alignment: No evidence of traumatic malalignment. Skull base and vertebrae: No acute fracture. No primary bone lesion or focal pathologic process. Soft tissues and spinal canal: No prevertebral fluid or swelling. No visible canal hematoma. Disc levels: Multilevel spondylosis and cervical facet arthropathy. Posterior disc osteophyte complexes cause multilevel mild effacement of the ventral thecal sac. No high-grade spinal canal narrowing. Uncovertebral spurring and facet arthropathy cause multilevel neural foraminal narrowing which is advanced on the right at C3-C4 on the right at C4-C5. Upper chest: No acute abnormality. Other: None.  IMPRESSION: 1. No acute intracranial abnormality. Generalized atrophy and small vessel white matter disease. 2. No acute fracture in the cervical spine. Multilevel degenerative spondylosis. 3. Soft tissue contusion about the right orbit. No underlying fracture. Electronically Signed   By: Placido Sou M.D.   On: 07/16/2022 22:00   CT Maxillofacial Wo Contrast  Result Date: 07/16/2022 CLINICAL DATA:  Head trauma after fall; bruising to the left eye EXAM: CT HEAD WITHOUT CONTRAST CT MAXILLOFACIAL WITHOUT CONTRAST CT CERVICAL SPINE WITHOUT CONTRAST TECHNIQUE: Multidetector CT imaging of the head, cervical spine, and maxillofacial structures were performed using the standard protocol without intravenous contrast. Multiplanar CT image reconstructions of the cervical spine and maxillofacial structures were also generated. RADIATION DOSE REDUCTION: This exam was performed according to the departmental dose-optimization program which includes automated exposure control, adjustment of the mA and/or kV according to patient size and/or use of iterative reconstruction technique. COMPARISON:  None available FINDINGS: CT HEAD FINDINGS Brain: No intracranial hemorrhage, mass effect, or evidence of acute infarct. No hydrocephalus.  No extra-axial fluid collection. Generalized cerebral atrophy. Ill-defined hypoattenuation within the cerebral white matter is nonspecific but consistent with chronic small vessel ischemic disease. Chronic lacunar infarct left basal ganglia. Vascular: No hyperdense vessel. Intracranial arterial calcification. Skull: No fracture or focal lesion. Other: None. CT MAXILLOFACIAL FINDINGS Osseous: No fracture or mandibular dislocation. No destructive process. Orbits: Negative. No traumatic or inflammatory finding. Sinuses: Clear. Soft tissues: Soft tissue contusion about the right orbit. CT CERVICAL SPINE FINDINGS Alignment: No evidence of traumatic malalignment. Skull base and vertebrae: No acute  fracture. No primary bone lesion or focal pathologic process. Soft tissues and spinal canal: No prevertebral fluid or swelling. No visible canal hematoma. Disc levels: Multilevel spondylosis and cervical facet arthropathy. Posterior disc osteophyte complexes cause multilevel mild effacement of the ventral thecal sac. No high-grade spinal canal narrowing. Uncovertebral spurring and facet arthropathy cause multilevel neural foraminal narrowing which is advanced on the right at C3-C4 on the right at C4-C5. Upper chest: No acute abnormality. Other: None. IMPRESSION: 1. No acute intracranial abnormality. Generalized atrophy and small vessel white matter disease. 2. No acute fracture in the cervical spine. Multilevel degenerative spondylosis. 3. Soft tissue contusion about the right orbit. No underlying fracture. Electronically Signed   By: Placido Sou M.D.   On: 07/16/2022 22:00   CT Cervical Spine Wo Contrast  Result Date: 07/16/2022 CLINICAL DATA:  Head trauma after fall; bruising to the left eye EXAM: CT HEAD WITHOUT CONTRAST CT MAXILLOFACIAL WITHOUT CONTRAST CT CERVICAL SPINE WITHOUT CONTRAST TECHNIQUE: Multidetector CT imaging of the head, cervical spine, and maxillofacial structures were performed using the standard protocol without intravenous contrast. Multiplanar CT image reconstructions of the cervical spine and maxillofacial structures were also generated. RADIATION DOSE REDUCTION: This exam was performed according to the departmental dose-optimization program which includes automated exposure control, adjustment of the mA and/or kV according to patient size and/or use of iterative reconstruction technique. COMPARISON:  None available FINDINGS: CT HEAD FINDINGS Brain: No intracranial hemorrhage, mass effect, or evidence of acute infarct. No hydrocephalus. No extra-axial fluid collection. Generalized cerebral atrophy. Ill-defined hypoattenuation within the cerebral white matter is nonspecific but  consistent with chronic small vessel ischemic disease. Chronic lacunar infarct left basal ganglia. Vascular: No hyperdense vessel. Intracranial arterial calcification. Skull: No fracture or focal lesion. Other: None. CT MAXILLOFACIAL FINDINGS Osseous: No fracture or mandibular dislocation. No destructive process. Orbits: Negative. No traumatic or inflammatory finding. Sinuses: Clear. Soft tissues: Soft tissue contusion about the right orbit. CT CERVICAL SPINE FINDINGS Alignment: No evidence of traumatic malalignment. Skull base and vertebrae: No acute fracture. No primary bone lesion or focal pathologic process. Soft tissues and spinal canal: No prevertebral fluid or swelling. No visible canal hematoma. Disc levels: Multilevel spondylosis and cervical facet arthropathy. Posterior disc osteophyte complexes cause multilevel mild effacement of the ventral thecal sac. No high-grade spinal canal narrowing. Uncovertebral spurring and facet arthropathy cause multilevel neural foraminal narrowing which is advanced on the right at C3-C4 on the right at C4-C5. Upper chest: No acute abnormality. Other: None. IMPRESSION: 1. No acute intracranial abnormality. Generalized atrophy and small vessel white matter disease. 2. No acute fracture in the cervical spine. Multilevel degenerative spondylosis. 3. Soft tissue contusion about the right orbit. No underlying fracture. Electronically Signed   By: Placido Sou M.D.   On: 07/16/2022 22:00   DG Hip Unilat W or Wo Pelvis 2-3 Views Right  Result Date: 07/16/2022 CLINICAL DATA:  Right hip pain, fall EXAM: DG HIP (WITH OR WITHOUT PELVIS) 2-3V  RIGHT COMPARISON:  None Available. FINDINGS: No fracture or dislocation is seen. Bilateral hip joint spaces are preserved. Visualized bony pelvis appears intact. IMPRESSION: Negative. Electronically Signed   By: Julian Hy M.D.   On: 07/16/2022 21:38    Procedures Procedures    Medications Ordered in ED Medications - No data to  display  ED Course/ Medical Decision Making/ A&P                           Medical Decision Making Amount and/or Complexity of Data Reviewed Radiology: ordered.   Patient presents due to head injury.  Parents includes not limited to intracranial hemorrhage, cervical spine injury, basilar skull fracture.  On exam patient is neurologically intact without any appreciable deficits.  She does have periorbital ecchymosis so I will order CT maxillofacial.  We will also proceed ct head and neck imaging given pain.  Pain on the right hip as well so we will get plain films to evaluate for fracture.   I ordered, reviewed and interpreted imaging studies.  Agree with radiologist dictation patient.  Patient has a contusion but no acute traumatic intracranial hemorrhage.  Given reassuring imaging and exam I feel patient is appropriate for outpatient follow-up as needed.  We discussed concussion symptoms, follow up plan and return precautions.  Patient discharged stable condition.        Final Clinical Impression(s) / ED Diagnoses Final diagnoses:  Injury of head, initial encounter    Rx / DC Orders ED Discharge Orders     None         Sherrill Raring, Hershal Coria 07/16/22 2318    Teressa Lower, MD 07/18/22 [phone]

## 2022-07-16 NOTE — ED Triage Notes (Signed)
Pt reports fall this afternoon and hit her head. Pt went to UC but she was sent to ER. Pt has bruising to the left eye.

## 2022-08-19 DIAGNOSIS — M76822 Posterior tibial tendinitis, left leg: Secondary | ICD-10-CM | POA: Diagnosis not present

## 2022-08-19 DIAGNOSIS — M79672 Pain in left foot: Secondary | ICD-10-CM | POA: Diagnosis not present

## 2022-08-19 DIAGNOSIS — M722 Plantar fascial fibromatosis: Secondary | ICD-10-CM | POA: Diagnosis not present

## 2022-08-21 DIAGNOSIS — J069 Acute upper respiratory infection, unspecified: Secondary | ICD-10-CM | POA: Diagnosis not present

## 2022-11-18 DIAGNOSIS — Z1231 Encounter for screening mammogram for malignant neoplasm of breast: Secondary | ICD-10-CM | POA: Diagnosis not present

## 2022-11-26 DIAGNOSIS — N6322 Unspecified lump in the left breast, upper inner quadrant: Secondary | ICD-10-CM | POA: Diagnosis not present

## 2022-11-26 DIAGNOSIS — R928 Other abnormal and inconclusive findings on diagnostic imaging of breast: Secondary | ICD-10-CM | POA: Diagnosis not present

## 2022-11-28 DIAGNOSIS — R7301 Impaired fasting glucose: Secondary | ICD-10-CM | POA: Diagnosis not present

## 2022-11-28 DIAGNOSIS — E782 Mixed hyperlipidemia: Secondary | ICD-10-CM | POA: Diagnosis not present

## 2022-12-03 DIAGNOSIS — Z0000 Encounter for general adult medical examination without abnormal findings: Secondary | ICD-10-CM | POA: Diagnosis not present

## 2022-12-04 DIAGNOSIS — R945 Abnormal results of liver function studies: Secondary | ICD-10-CM | POA: Diagnosis not present

## 2022-12-04 DIAGNOSIS — M8580 Other specified disorders of bone density and structure, unspecified site: Secondary | ICD-10-CM | POA: Diagnosis not present

## 2022-12-04 DIAGNOSIS — D509 Iron deficiency anemia, unspecified: Secondary | ICD-10-CM | POA: Diagnosis not present

## 2022-12-04 DIAGNOSIS — R7303 Prediabetes: Secondary | ICD-10-CM | POA: Diagnosis not present

## 2022-12-04 DIAGNOSIS — J302 Other seasonal allergic rhinitis: Secondary | ICD-10-CM | POA: Diagnosis not present

## 2022-12-04 DIAGNOSIS — M25473 Effusion, unspecified ankle: Secondary | ICD-10-CM | POA: Diagnosis not present

## 2022-12-04 DIAGNOSIS — E669 Obesity, unspecified: Secondary | ICD-10-CM | POA: Diagnosis not present

## 2022-12-04 DIAGNOSIS — R928 Other abnormal and inconclusive findings on diagnostic imaging of breast: Secondary | ICD-10-CM | POA: Diagnosis not present

## 2022-12-04 DIAGNOSIS — E782 Mixed hyperlipidemia: Secondary | ICD-10-CM | POA: Diagnosis not present

## 2022-12-04 DIAGNOSIS — M7732 Calcaneal spur, left foot: Secondary | ICD-10-CM | POA: Diagnosis not present

## 2022-12-04 DIAGNOSIS — K219 Gastro-esophageal reflux disease without esophagitis: Secondary | ICD-10-CM | POA: Diagnosis not present

## 2022-12-04 DIAGNOSIS — I10 Essential (primary) hypertension: Secondary | ICD-10-CM | POA: Diagnosis not present

## 2022-12-10 DIAGNOSIS — N6322 Unspecified lump in the left breast, upper inner quadrant: Secondary | ICD-10-CM | POA: Diagnosis not present

## 2022-12-14 ENCOUNTER — Encounter (HOSPITAL_COMMUNITY): Payer: Self-pay | Admitting: Emergency Medicine

## 2022-12-14 ENCOUNTER — Emergency Department (HOSPITAL_COMMUNITY): Payer: Medicare HMO

## 2022-12-14 ENCOUNTER — Other Ambulatory Visit: Payer: Self-pay

## 2022-12-14 ENCOUNTER — Emergency Department (HOSPITAL_COMMUNITY)
Admission: EM | Admit: 2022-12-14 | Discharge: 2022-12-14 | Disposition: A | Discharge status: Home / Self Care | Payer: Medicare HMO | Attending: Emergency Medicine | Admitting: Emergency Medicine

## 2022-12-14 DIAGNOSIS — I160 Hypertensive urgency: Secondary | ICD-10-CM | POA: Diagnosis not present

## 2022-12-14 DIAGNOSIS — R519 Headache, unspecified: Secondary | ICD-10-CM | POA: Diagnosis not present

## 2022-12-14 DIAGNOSIS — Z79899 Other long term (current) drug therapy: Secondary | ICD-10-CM | POA: Insufficient documentation

## 2022-12-14 DIAGNOSIS — I6381 Other cerebral infarction due to occlusion or stenosis of small artery: Secondary | ICD-10-CM | POA: Diagnosis not present

## 2022-12-14 DIAGNOSIS — Z7982 Long term (current) use of aspirin: Secondary | ICD-10-CM | POA: Diagnosis not present

## 2022-12-14 LAB — COMPREHENSIVE METABOLIC PANEL
ALT: 32 U/L (ref 0–44)
AST: 23 U/L (ref 15–41)
Albumin: 3.8 g/dL (ref 3.5–5.0)
Alkaline Phosphatase: 63 U/L (ref 38–126)
Anion gap: 7 (ref 5–15)
BUN: 18 mg/dL (ref 8–23)
CO2: 28 mmol/L (ref 22–32)
Calcium: 9.2 mg/dL (ref 8.9–10.3)
Chloride: 97 mmol/L — ABNORMAL LOW (ref 98–111)
Creatinine, Ser: 0.47 mg/dL (ref 0.44–1.00)
GFR, Estimated: >60 mL/min (ref >60)
Glucose, Bld: 99 mg/dL (ref 70–99)
Potassium: 3.7 mmol/L (ref 3.5–5.1)
Sodium: 132 mmol/L — ABNORMAL LOW (ref 135–145)
Total Bilirubin: 0.4 mg/dL (ref 0.3–1.2)
Total Protein: 6.4 g/dL — ABNORMAL LOW (ref 6.5–8.1)

## 2022-12-14 LAB — CBC WITH DIFFERENTIAL/PLATELET
Abs Immature Granulocytes: 0.02 10*3/uL (ref 0.00–0.07)
Basophils Absolute: 0 10*3/uL (ref 0.0–0.1)
Basophils Relative: 1 %
Eosinophils Absolute: 0.2 10*3/uL (ref 0.0–0.5)
Eosinophils Relative: 2 %
HCT: 39.1 % (ref 36.0–46.0)
Hemoglobin: 13.2 g/dL (ref 12.0–15.0)
Immature Granulocytes: 0 %
Lymphocytes Relative: 10 %
Lymphs Abs: 0.7 10*3/uL (ref 0.7–4.0)
MCH: 29.7 pg (ref 26.0–34.0)
MCHC: 33.8 g/dL (ref 30.0–36.0)
MCV: 88.1 fL (ref 80.0–100.0)
Monocytes Absolute: 0.7 10*3/uL (ref 0.1–1.0)
Monocytes Relative: 9 %
Neutro Abs: 5.9 10*3/uL (ref 1.7–7.7)
Neutrophils Relative %: 78 %
Platelets: 214 10*3/uL (ref 150–400)
RBC: 4.44 MIL/uL (ref 3.87–5.11)
RDW: 14.3 % (ref 11.5–15.5)
WBC: 7.5 10*3/uL (ref 4.0–10.5)
nRBC: 0 % (ref 0.0–0.2)

## 2022-12-14 LAB — C-REACTIVE PROTEIN: CRP: 0.7 mg/dL (ref <1.0)

## 2022-12-14 LAB — SEDIMENTATION RATE: Sed Rate: 3 mm/hr (ref 0–22)

## 2022-12-14 MED ORDER — METOCLOPRAMIDE HCL 5 MG/ML IJ SOLN
5.0000 mg | Freq: Once | INTRAMUSCULAR | Status: AC
  Administered 2022-12-14: 5 mg via INTRAVENOUS
  Filled 2022-12-14: qty 2

## 2022-12-14 MED ORDER — DIPHENHYDRAMINE HCL 50 MG/ML IJ SOLN
12.5000 mg | Freq: Once | INTRAMUSCULAR | Status: AC
  Administered 2022-12-14: 12.5 mg via INTRAVENOUS
  Filled 2022-12-14: qty 1

## 2022-12-14 MED ORDER — SODIUM CHLORIDE 0.9 % IV BOLUS
500.0000 mL | Freq: Once | INTRAVENOUS | Status: AC
  Administered 2022-12-14: 500 mL via INTRAVENOUS

## 2022-12-14 NOTE — Discharge Instructions (Addendum)
Call your doctor tomorrow for further evaluation of your high blood pressure.  For now you can take an extra dose of your high blood pressure medicine when you get home.  If you develop continued, recurrent, or worsening headache, fever, neck stiffness, vomiting, blurry or double vision, chest pain, shortness of breath, weakness or numbness in your arms or legs, trouble speaking, or any other new/concerning symptoms then return to the ER for evaluation.

## 2022-12-14 NOTE — ED Triage Notes (Signed)
Pt started new blood pressure medicine last week and states headache has been worse and blood pressure not improved.

## 2022-12-14 NOTE — ED Provider Notes (Signed)
[place] EMERGENCY DEPARTMENT AT [facility] Provider Note   CSN: [id number] Arrival date & time: 12/14/22  1103     History  Chief Complaint  Patient presents with   Hypertension   Headache    Carrie Mayer is a 77 y.o. female.  HPI 77 year old female presents with a headache. Headache started yesterday and was mild and has progressively worsened. It's currently a 9. She was recently started on a blood pressure medicine (has been taking it for 1 week).  She thinks it was 5 mg losartan.  However her blood pressure is elevated and she is having this headache.  She feels like it hurts all around her left eye but her globe itself does not hurt. She transiently had some blurry vision yesterday and was having a hard time thinking.  The pain is right over her left temple.  Home Medications Prior to Admission medications   Medication Sig Start Date End Date Taking? Authorizing Provider  aspirin EC 81 MG tablet Take 81 mg by mouth daily.    [provider]  meloxicam (MOBIC) 15 MG tablet Take 15 mg by mouth daily.    [provider]  Multiple Vitamin (MULTIVITAMIN WITH MINERALS) TABS tablet Take 1 tablet by mouth daily.    [provider]  rosuvastatin (CRESTOR) 5 MG tablet Take 5 mg by mouth daily. 12/29/19   [provider]  UNABLE TO FIND Med Name: Tawny Asal Supplement for calcium    [provider]  UNABLE TO FIND Med Name: Precious Gilding softgels for heartburn    [provider]  UNABLE TO FIND Med Name:Doterra TriEase Softgels for allergy relief    [provider]  UNABLE TO FIND Med Name: Doterra PB Assist probiotic    [provider]  UNABLE TO FIND Med Name: Guadelupe Sabin for gut health    [provider]  Ardelia Mems TO FIND Med Name: Ardeth Perfect On Guard softgels immune support    [provider]  UNABLE TO FIND Med Name: Joya Martyr for energy & stamina    [provider]      Allergies    Codeine    Review of Systems   Review of Systems  Eyes:  Positive for photophobia and pain. Negative for visual disturbance.  Cardiovascular:  Negative for chest pain.  Gastrointestinal:  Negative for vomiting.  Musculoskeletal:  Negative for neck pain.  Neurological:  Positive for headaches. Negative for weakness and numbness.    Physical Exam Updated Vital Signs BP (!) 152/89   Pulse 89   Temp 98 F (36.7 C)   Resp 18   Ht 5\' 2"  (1.575 m)   Wt 72.6 kg   SpO2 92%   BMI 29.26 kg/m  Physical Exam Vitals and nursing note reviewed.  Constitutional:      Appearance: She is well-developed.  HENT:     Head: Normocephalic and atraumatic.   Eyes:     Extraocular Movements: Extraocular movements intact.     Pupils: Pupils are equal, round, and reactive to light.  Cardiovascular:     Rate and Rhythm: Normal rate and regular rhythm.     Heart sounds: Normal heart sounds.  Pulmonary:     Effort: Pulmonary effort is normal.     Breath sounds: Normal breath sounds.  Abdominal:     Palpations: Abdomen is soft.     Tenderness: There is no abdominal tenderness.  Musculoskeletal:     Cervical back:  No rigidity.  Skin:    General: Skin is warm and dry.  Neurological:     Mental Status: She is alert.     Comments: CN 3-12 grossly intact. 5/5 strength in all 4 extremities. Grossly normal sensation. Normal finger to nose.      ED Results / Procedures / Treatments   Labs (all labs ordered are listed, but only abnormal results are displayed) Labs Reviewed  COMPREHENSIVE METABOLIC PANEL - Abnormal; Notable for the following components:      Result Value   Sodium 132 (*)    Chloride 97 (*)    Total Protein 6.4 (*)    All other components within normal limits  CBC WITH DIFFERENTIAL/PLATELET  SEDIMENTATION RATE  C-REACTIVE PROTEIN    EKG None  Radiology CT Head Wo Contrast  Result Date: 12/14/2022 CLINICAL DATA:  Provided history:  Headache, new onset. EXAM: CT HEAD WITHOUT CONTRAST TECHNIQUE: Contiguous axial images were obtained from the base of the skull through the vertex without intravenous contrast. RADIATION DOSE REDUCTION: This exam was performed according to the departmental dose-optimization program which includes automated exposure control, adjustment of the mA and/or kV according to patient size and/or use of iterative reconstruction technique. COMPARISON:  Head CT 07/16/2022.  Maxillofacial CT 07/16/2022. FINDINGS: Brain: Mild generalized cerebral atrophy. Chronic lacunar infarct within the genu of the left internal capsule (series 2, image 14). There is no acute intracranial hemorrhage. No demarcated cortical infarct. No extra-axial fluid collection. No evidence of an intracranial mass. No midline shift. Vascular: No hyperdense vessel. Atherosclerotic calcifications. Skull: No fracture or aggressive osseous lesion. Sinuses/Orbits: No mass or acute finding within the imaged orbits. No significant paranasal sinus disease at the imaged levels. IMPRESSION: 1.  No evidence of an acute intracranial abnormality. 2. Chronic lacunar infarct within the genu of the left internal capsule. 3. Mild generalized cerebral atrophy. Electronically Signed   By: Jackey LogeKyle  Golden D.O.   On: 12/14/2022 12:29    Procedures Procedures    Medications Ordered in ED Medications  sodium chloride 0.9 % bolus 500 mL (0 mLs Intravenous Stopped 12/14/22 1423)  metoCLOPramide (REGLAN) injection 5 mg (5 mg Intravenous Given 12/14/22 1300)  diphenhydrAMINE (BENADRYL) injection 12.5 mg (12.5 mg Intravenous Given 12/14/22 1301)    ED Course/ Medical Decision Making/ A&P                             Medical Decision Making Amount and/or Complexity of Data Reviewed Labs: ordered.    Details: Normal ESR.  Normal WBC and unremarkable seeing PT Radiology: ordered and independent interpretation performed.    Details: CT head without acute head bleed or  mass.  Risk Prescription drug management.   Patient feels better after some Reglan and Benadryl.  Headache is still present but better and she prefers no further medications.  She did have some tenderness over her temple but a normal ESR and reports no current vision symptoms or ocular pain.  My suspicion this is temporal arteritis is lower.  She has had a history of migraines.  I do not think this is related to her hypertension though it is still elevated and I recommended she follow-up closely with her PCP and can double her dose today.  Otherwise she has no other signs/symptoms of endorgan damage.  She feels well enough for discharge.  I have low suspicion of acute CNS emergency, especially CNS infection.  Will discharge home with return precautions.  Final Clinical Impression(s) / ED Diagnoses Final diagnoses:  Left-sided headache  Hypertensive urgency    Rx / DC Orders ED Discharge Orders     None         Pricilla Loveless, MD 12/14/22 1544

## 2022-12-17 DIAGNOSIS — Z1211 Encounter for screening for malignant neoplasm of colon: Secondary | ICD-10-CM | POA: Diagnosis not present

## 2022-12-17 DIAGNOSIS — C50212 Malignant neoplasm of upper-inner quadrant of left female breast: Secondary | ICD-10-CM | POA: Insufficient documentation

## 2022-12-18 DIAGNOSIS — C50912 Malignant neoplasm of unspecified site of left female breast: Secondary | ICD-10-CM | POA: Diagnosis not present

## 2022-12-19 DIAGNOSIS — I10 Essential (primary) hypertension: Secondary | ICD-10-CM | POA: Diagnosis not present

## 2022-12-19 DIAGNOSIS — C50912 Malignant neoplasm of unspecified site of left female breast: Secondary | ICD-10-CM | POA: Diagnosis not present

## 2022-12-19 DIAGNOSIS — U071 COVID-19: Secondary | ICD-10-CM | POA: Diagnosis not present

## 2022-12-19 DIAGNOSIS — C50919 Malignant neoplasm of unspecified site of unspecified female breast: Secondary | ICD-10-CM | POA: Diagnosis not present

## 2022-12-19 DIAGNOSIS — R110 Nausea: Secondary | ICD-10-CM | POA: Diagnosis not present

## 2022-12-24 DIAGNOSIS — C50912 Malignant neoplasm of unspecified site of left female breast: Secondary | ICD-10-CM | POA: Diagnosis not present

## 2022-12-31 DIAGNOSIS — K219 Gastro-esophageal reflux disease without esophagitis: Secondary | ICD-10-CM | POA: Diagnosis not present

## 2022-12-31 DIAGNOSIS — H9190 Unspecified hearing loss, unspecified ear: Secondary | ICD-10-CM | POA: Diagnosis not present

## 2022-12-31 DIAGNOSIS — Z79899 Other long term (current) drug therapy: Secondary | ICD-10-CM | POA: Diagnosis not present

## 2022-12-31 DIAGNOSIS — I10 Essential (primary) hypertension: Secondary | ICD-10-CM | POA: Diagnosis not present

## 2022-12-31 DIAGNOSIS — J309 Allergic rhinitis, unspecified: Secondary | ICD-10-CM | POA: Diagnosis not present

## 2022-12-31 DIAGNOSIS — G709 Myoneural disorder, unspecified: Secondary | ICD-10-CM | POA: Diagnosis not present

## 2022-12-31 DIAGNOSIS — Z170 Estrogen receptor positive status [ER+]: Secondary | ICD-10-CM | POA: Diagnosis not present

## 2022-12-31 DIAGNOSIS — C50212 Malignant neoplasm of upper-inner quadrant of left female breast: Secondary | ICD-10-CM | POA: Diagnosis not present

## 2022-12-31 DIAGNOSIS — Z780 Asymptomatic menopausal state: Secondary | ICD-10-CM | POA: Diagnosis not present

## 2022-12-31 DIAGNOSIS — E7800 Pure hypercholesterolemia, unspecified: Secondary | ICD-10-CM | POA: Diagnosis not present

## 2022-12-31 DIAGNOSIS — C50912 Malignant neoplasm of unspecified site of left female breast: Secondary | ICD-10-CM | POA: Diagnosis not present

## 2022-12-31 DIAGNOSIS — K449 Diaphragmatic hernia without obstruction or gangrene: Secondary | ICD-10-CM | POA: Diagnosis not present

## 2023-01-02 DIAGNOSIS — C50912 Malignant neoplasm of unspecified site of left female breast: Secondary | ICD-10-CM | POA: Diagnosis not present

## 2023-01-21 DIAGNOSIS — Z803 Family history of malignant neoplasm of breast: Secondary | ICD-10-CM | POA: Diagnosis not present

## 2023-01-21 DIAGNOSIS — C50912 Malignant neoplasm of unspecified site of left female breast: Secondary | ICD-10-CM | POA: Diagnosis not present

## 2023-01-21 DIAGNOSIS — C50212 Malignant neoplasm of upper-inner quadrant of left female breast: Secondary | ICD-10-CM | POA: Diagnosis not present

## 2023-01-21 DIAGNOSIS — Z170 Estrogen receptor positive status [ER+]: Secondary | ICD-10-CM | POA: Diagnosis not present

## 2023-01-21 DIAGNOSIS — Z8042 Family history of malignant neoplasm of prostate: Secondary | ICD-10-CM | POA: Insufficient documentation

## 2023-01-21 DIAGNOSIS — Z09 Encounter for follow-up examination after completed treatment for conditions other than malignant neoplasm: Secondary | ICD-10-CM | POA: Diagnosis not present

## 2023-01-22 DIAGNOSIS — I10 Essential (primary) hypertension: Secondary | ICD-10-CM | POA: Diagnosis not present

## 2023-01-22 DIAGNOSIS — Z6828 Body mass index (BMI) 28.0-28.9, adult: Secondary | ICD-10-CM | POA: Diagnosis not present

## 2023-01-22 DIAGNOSIS — E669 Obesity, unspecified: Secondary | ICD-10-CM | POA: Diagnosis not present

## 2023-01-22 DIAGNOSIS — C50919 Malignant neoplasm of unspecified site of unspecified female breast: Secondary | ICD-10-CM | POA: Diagnosis not present

## 2023-02-17 DIAGNOSIS — Z6828 Body mass index (BMI) 28.0-28.9, adult: Secondary | ICD-10-CM | POA: Diagnosis not present

## 2023-02-17 DIAGNOSIS — R252 Cramp and spasm: Secondary | ICD-10-CM | POA: Diagnosis not present

## 2023-02-17 DIAGNOSIS — E669 Obesity, unspecified: Secondary | ICD-10-CM | POA: Diagnosis not present

## 2023-02-17 DIAGNOSIS — Z79899 Other long term (current) drug therapy: Secondary | ICD-10-CM | POA: Diagnosis not present

## 2023-02-17 DIAGNOSIS — I10 Essential (primary) hypertension: Secondary | ICD-10-CM | POA: Diagnosis not present

## 2023-02-17 DIAGNOSIS — Z713 Dietary counseling and surveillance: Secondary | ICD-10-CM | POA: Diagnosis not present

## 2023-02-17 DIAGNOSIS — C50919 Malignant neoplasm of unspecified site of unspecified female breast: Secondary | ICD-10-CM | POA: Diagnosis not present

## 2023-02-23 DIAGNOSIS — I10 Essential (primary) hypertension: Secondary | ICD-10-CM | POA: Diagnosis not present

## 2023-02-23 DIAGNOSIS — I872 Venous insufficiency (chronic) (peripheral): Secondary | ICD-10-CM | POA: Diagnosis not present

## 2023-02-23 DIAGNOSIS — Z6828 Body mass index (BMI) 28.0-28.9, adult: Secondary | ICD-10-CM | POA: Diagnosis not present

## 2023-02-23 DIAGNOSIS — E669 Obesity, unspecified: Secondary | ICD-10-CM | POA: Diagnosis not present

## 2023-02-23 DIAGNOSIS — R600 Localized edema: Secondary | ICD-10-CM | POA: Diagnosis not present

## 2023-04-28 DIAGNOSIS — H6123 Impacted cerumen, bilateral: Secondary | ICD-10-CM | POA: Diagnosis not present

## 2023-04-28 DIAGNOSIS — Z170 Estrogen receptor positive status [ER+]: Secondary | ICD-10-CM | POA: Diagnosis not present

## 2023-04-28 DIAGNOSIS — H903 Sensorineural hearing loss, bilateral: Secondary | ICD-10-CM | POA: Diagnosis not present

## 2023-04-28 DIAGNOSIS — H6983 Other specified disorders of Eustachian tube, bilateral: Secondary | ICD-10-CM | POA: Diagnosis not present

## 2023-04-28 DIAGNOSIS — Z780 Asymptomatic menopausal state: Secondary | ICD-10-CM | POA: Diagnosis not present

## 2023-04-28 DIAGNOSIS — C50212 Malignant neoplasm of upper-inner quadrant of left female breast: Secondary | ICD-10-CM | POA: Diagnosis not present

## 2023-04-28 DIAGNOSIS — M8580 Other specified disorders of bone density and structure, unspecified site: Secondary | ICD-10-CM | POA: Diagnosis not present

## 2023-06-02 DIAGNOSIS — E782 Mixed hyperlipidemia: Secondary | ICD-10-CM | POA: Diagnosis not present

## 2023-06-02 DIAGNOSIS — R7303 Prediabetes: Secondary | ICD-10-CM | POA: Diagnosis not present

## 2023-06-05 DIAGNOSIS — M25472 Effusion, left ankle: Secondary | ICD-10-CM | POA: Diagnosis not present

## 2023-06-05 DIAGNOSIS — E782 Mixed hyperlipidemia: Secondary | ICD-10-CM | POA: Diagnosis not present

## 2023-06-05 DIAGNOSIS — R945 Abnormal results of liver function studies: Secondary | ICD-10-CM | POA: Diagnosis not present

## 2023-06-05 DIAGNOSIS — M8580 Other specified disorders of bone density and structure, unspecified site: Secondary | ICD-10-CM | POA: Diagnosis not present

## 2023-06-05 DIAGNOSIS — E669 Obesity, unspecified: Secondary | ICD-10-CM | POA: Diagnosis not present

## 2023-06-05 DIAGNOSIS — R7303 Prediabetes: Secondary | ICD-10-CM | POA: Diagnosis not present

## 2023-06-05 DIAGNOSIS — M7732 Calcaneal spur, left foot: Secondary | ICD-10-CM | POA: Diagnosis not present

## 2023-06-05 DIAGNOSIS — D509 Iron deficiency anemia, unspecified: Secondary | ICD-10-CM | POA: Diagnosis not present

## 2023-06-05 DIAGNOSIS — I10 Essential (primary) hypertension: Secondary | ICD-10-CM | POA: Diagnosis not present

## 2023-06-05 DIAGNOSIS — K219 Gastro-esophageal reflux disease without esophagitis: Secondary | ICD-10-CM | POA: Diagnosis not present

## 2023-07-16 DIAGNOSIS — D2261 Melanocytic nevi of right upper limb, including shoulder: Secondary | ICD-10-CM | POA: Diagnosis not present

## 2023-07-16 DIAGNOSIS — C50919 Malignant neoplasm of unspecified site of unspecified female breast: Secondary | ICD-10-CM | POA: Diagnosis not present

## 2023-07-16 DIAGNOSIS — D225 Melanocytic nevi of trunk: Secondary | ICD-10-CM | POA: Diagnosis not present

## 2023-07-16 DIAGNOSIS — Z1283 Encounter for screening for malignant neoplasm of skin: Secondary | ICD-10-CM | POA: Diagnosis not present

## 2023-07-16 DIAGNOSIS — D485 Neoplasm of uncertain behavior of skin: Secondary | ICD-10-CM | POA: Diagnosis not present

## 2023-07-16 DIAGNOSIS — D2272 Melanocytic nevi of left lower limb, including hip: Secondary | ICD-10-CM | POA: Diagnosis not present

## 2023-07-16 DIAGNOSIS — Z170 Estrogen receptor positive status [ER+]: Secondary | ICD-10-CM | POA: Diagnosis not present

## 2023-07-16 DIAGNOSIS — C50212 Malignant neoplasm of upper-inner quadrant of left female breast: Secondary | ICD-10-CM | POA: Diagnosis not present

## 2023-07-16 DIAGNOSIS — L821 Other seborrheic keratosis: Secondary | ICD-10-CM | POA: Diagnosis not present

## 2023-07-20 DIAGNOSIS — C50212 Malignant neoplasm of upper-inner quadrant of left female breast: Secondary | ICD-10-CM | POA: Diagnosis not present

## 2023-07-20 DIAGNOSIS — Z170 Estrogen receptor positive status [ER+]: Secondary | ICD-10-CM | POA: Diagnosis not present

## 2023-07-29 DIAGNOSIS — C50212 Malignant neoplasm of upper-inner quadrant of left female breast: Secondary | ICD-10-CM | POA: Diagnosis not present

## 2023-07-29 DIAGNOSIS — Z853 Personal history of malignant neoplasm of breast: Secondary | ICD-10-CM | POA: Diagnosis not present

## 2023-07-29 DIAGNOSIS — R92313 Mammographic fatty tissue density, bilateral breasts: Secondary | ICD-10-CM | POA: Diagnosis not present

## 2023-07-29 DIAGNOSIS — Z170 Estrogen receptor positive status [ER+]: Secondary | ICD-10-CM | POA: Diagnosis not present

## 2023-07-29 DIAGNOSIS — N6489 Other specified disorders of breast: Secondary | ICD-10-CM | POA: Diagnosis not present

## 2023-08-10 DIAGNOSIS — C50212 Malignant neoplasm of upper-inner quadrant of left female breast: Secondary | ICD-10-CM | POA: Diagnosis not present

## 2023-08-10 DIAGNOSIS — Z170 Estrogen receptor positive status [ER+]: Secondary | ICD-10-CM | POA: Diagnosis not present

## 2023-08-20 DIAGNOSIS — D485 Neoplasm of uncertain behavior of skin: Secondary | ICD-10-CM | POA: Diagnosis not present

## 2023-08-20 DIAGNOSIS — L988 Other specified disorders of the skin and subcutaneous tissue: Secondary | ICD-10-CM | POA: Diagnosis not present

## 2023-11-05 ENCOUNTER — Ambulatory Visit (INDEPENDENT_AMBULATORY_CARE_PROVIDER_SITE_OTHER): Payer: Medicare HMO

## 2023-11-11 ENCOUNTER — Ambulatory Visit: Admission: EM | Admit: 2023-11-11 | Discharge: 2023-11-11 | Disposition: A | Discharge status: Home / Self Care

## 2023-11-11 DIAGNOSIS — R051 Acute cough: Secondary | ICD-10-CM

## 2023-11-11 DIAGNOSIS — H66001 Acute suppurative otitis media without spontaneous rupture of ear drum, right ear: Secondary | ICD-10-CM | POA: Diagnosis not present

## 2023-11-11 LAB — POC COVID19/FLU A&B COMBO
Covid Antigen, POC: NEGATIVE
Influenza A Antigen, POC: NEGATIVE
Influenza B Antigen, POC: NEGATIVE

## 2023-11-11 MED ORDER — AMOXICILLIN-POT CLAVULANATE 875-125 MG PO TABS: 1.0000 | ORAL_TABLET | Freq: Two times a day (BID) | ORAL | 0 refills | Status: AC

## 2023-11-11 MED ORDER — BENZONATATE 100 MG PO CAPS
100.0000 mg | ORAL_CAPSULE | Freq: Three times a day (TID) | ORAL | 0 refills | Status: DC | PRN
Start: 2023-11-11 — End: 2024-06-06

## 2023-11-11 NOTE — Discharge Instructions (Signed)
 You have an ear infection in your right ear. Take the Augmentin as prescribed to treat it.  Your other symptoms are consistent with a viral infection and should improve over the next few days. If you develop chest pain or shortness of breath, go to the emergency room.  Your COVID-19 and influenza test is negative today.  Some things that can make you feel better are: - Increased rest - Increasing fluid with water/sugar free electrolytes - Acetaminophen and ibuprofen as needed for fever/pain - Salt water gargling, chloraseptic spray and throat lozenges - OTC guaifenesin (Mucinex) 600 mg twice daily - Saline sinus flushes or a neti pot - Humidifying the air -Tessalon Perles every 8 hours as needed for dry cough

## 2023-11-11 NOTE — ED Triage Notes (Signed)
 Pt reports cough, fever, sore, throat, congestion, body aches x 4 days.

## 2023-11-11 NOTE — ED Provider Notes (Signed)
 [facility]    CSN: [id number] Arrival date & time: 11/11/23  1506      History   Chief Complaint No chief complaint on file.   HPI Carrie Mayer is a 78 y.o. female.   Patient presents today with 4 to 5-day history of tactile fever, body aches and chills, congested cough, pain in her back when she takes a deep breath, runny and stuffy nose, sore throat, headache, bilateral ear pain worse in the right ear, and fatigue.  She denies wheezing, chest pain or tightness, abdominal pain, nausea/vomiting, diarrhea, and change in appetite.  Has been taking Tylenol and using essential oils for symptoms with mild temporary improvement.  Has also been using Flonase.  Patient denies needing inhalers in the past and denies history of lung disease or smoking/vaping history.    Past Medical History:  Diagnosis Date   Anemia    Arthritis    Diverticulitis    GERD (gastroesophageal reflux disease)    Hyperlipidemia     Patient Active Problem List   Diagnosis Date Noted   Pyogenic granuloma of skin 08/28/2016   Nausea with vomiting 04/03/2014    Past Surgical History:  Procedure Laterality Date   ABDOMINAL HYSTERECTOMY     CATARACT EXTRACTION W/PHACO Left 02/01/2015   Procedure: CATARACT EXTRACTION PHACO AND INTRAOCULAR LENS PLACEMENT (IOC);  Surgeon: Gemma Payor, MD;  Location: [facility];  Service: Ophthalmology;  Laterality: Left;  CDE 6.48   CATARACT EXTRACTION W/PHACO Right 03/01/2015   Procedure: CATARACT EXTRACTION PHACO AND INTRAOCULAR LENS PLACEMENT RIGHT EYE; CDE: 4.09;  Surgeon: Gemma Payor, MD;  Location: [facility];  Service: Ophthalmology;  Laterality: Right;   HEMORROIDECTOMY     MYOMECTOMY  1976    OB History     Gravida      Para      Term      Preterm      AB      Living  0      SAB      IAB      Ectopic      Multiple      Live Births               Home Medications    Prior to Admission medications   Medication Sig Start Date End  Date Taking? Authorizing Provider  amoxicillin-clavulanate (AUGMENTIN) 875-125 MG tablet Take 1 tablet by mouth 2 (two) times daily for 7 days. 11/11/23 11/18/23 Yes Valentino Nose, NP  benzonatate (TESSALON) 100 MG capsule Take 1 capsule (100 mg total) by mouth 3 (three) times daily as needed for cough. Do not take with alcohol or while driving or operating heavy machinery.  May cause drowsiness. 11/11/23  Yes Valentino Nose, NP  aspirin EC 81 MG tablet Take 81 mg by mouth daily.    [provider]  letrozole (FEMARA) 2.5 MG tablet Take 2.5 mg by mouth daily.    [provider]  losartan (COZAAR) 100 MG tablet Take 100 mg by mouth daily.    [provider]  meloxicam (MOBIC) 15 MG tablet Take 15 mg by mouth daily.    [provider]  Multiple Vitamin (MULTIVITAMIN WITH MINERALS) TABS tablet Take 1 tablet by mouth daily.    [provider]  rosuvastatin (CRESTOR) 5 MG tablet Take 5 mg by mouth daily. 12/29/19   [provider]  UNABLE TO FIND Med Name: Tawny Asal Supplement for calcium    [provider]  UNABLE TO FIND Med Name: Doterra DigestZen softgels for heartburn    [provider]  UNABLE TO FIND Med Name:Doterra TriEase Softgels for allergy relief    [provider]  UNABLE TO FIND Med Name: Doterra PB Assist probiotic    [provider]  UNABLE TO FIND Med Name: Guadelupe Sabin for gut health    [provider]  UNABLE TO FIND Med Name: Ardeth Perfect On Guard softgels immune support    [provider]  UNABLE TO FIND Med Name: Joya Martyr for energy & stamina    [provider]    Family History Family History  Problem Relation Age of Onset   Breast cancer Mother     Social History Social History   Tobacco Use   Smoking status: Never   Smokeless tobacco: Never  Substance Use Topics   Alcohol use: Yes    Alcohol/week: 1.0 standard drink of alcohol     Types: 1 Glasses of wine per week    Comment: Occasional   Drug use: No     Allergies   Codeine   Review of Systems Review of Systems Per HPI  Physical Exam Triage Vital Signs ED Triage Vitals  Encounter Vitals Group     BP 11/11/23 1642 (!) 179/80     Systolic BP Percentile --      Diastolic BP Percentile --      Pulse Rate 11/11/23 1642 94     Resp 11/11/23 1642 18     Temp 11/11/23 1642 100.2 F (37.9 C)     Temp Source 11/11/23 1642 Oral     SpO2 11/11/23 1642 92 %     Weight --      Height --      Head Circumference --      Peak Flow --      Pain Score 11/11/23 1644 3     Pain Loc --      Pain Education --      Exclude from Growth Chart --    No data found.  Updated Vital Signs BP (!) 179/80 (BP Location: Right Arm)   Pulse 94   Temp 100.2 F (37.9 C) (Oral)   Resp 18   SpO2 92%   Visual Acuity Right Eye Distance:   Left Eye Distance:   Bilateral Distance:    Right Eye Near:   Left Eye Near:    Bilateral Near:     Physical Exam Vitals and nursing note reviewed.  Constitutional:      General: She is not in acute distress.    Appearance: Normal appearance. She is not ill-appearing or toxic-appearing.  HENT:     Head: Normocephalic and atraumatic.     Right Ear: Ear canal and external ear normal. Tympanic membrane is erythematous and bulging. Tympanic membrane is not retracted.     Left Ear: Ear canal and external ear normal. Tympanic membrane is erythematous. Tympanic membrane is not bulging.     Nose: Congestion present. No rhinorrhea.     Mouth/Throat:     Mouth: Mucous membranes are moist.     Pharynx: Oropharynx is clear. No oropharyngeal exudate or posterior oropharyngeal erythema.  Eyes:     General: No scleral icterus.    Extraocular Movements: Extraocular movements intact.  Cardiovascular:     Rate and Rhythm: Normal rate and regular rhythm.  Pulmonary:     Effort: Pulmonary effort is normal. No respiratory distress.     Breath  sounds:  Normal breath sounds. No wheezing, rhonchi or rales.  Musculoskeletal:     Cervical back: Normal range of motion and neck supple.  Lymphadenopathy:     Cervical: No cervical adenopathy.  Skin:    General: Skin is warm and dry.     Coloration: Skin is not jaundiced or pale.     Findings: No erythema or rash.  Neurological:     Mental Status: She is alert and oriented to person, place, and time.  Psychiatric:        Behavior: Behavior is cooperative.      UC Treatments / Results  Labs (all labs ordered are listed, but only abnormal results are displayed) Labs Reviewed  POC COVID19/FLU A&B COMBO    EKG   Radiology No results found.  Procedures Procedures (including critical care time)  Medications Ordered in UC Medications - No data to display  Initial Impression / Assessment and Plan / UC Course  I have reviewed the triage vital signs and the nursing notes.  Pertinent labs & imaging results that were available during my care of the patient were reviewed by me and considered in my medical decision making (see chart for details).   Patient is mildly hypertensive in triage, otherwise vital signs are stable.  1. Acute cough 2. Non-recurrent acute suppurative otitis media of right ear without spontaneous rupture of tympanic membrane COVID-19 and influenza testing is negative today Suspect probable initial viral etiology, now has ear infection in the right ear Will treat with Augmentin twice daily for 7 days Regarding back pain, suspect musculoskeletal due to coughing; lungs are clear to auscultation bilaterally therefore x-ray imaging deferred Other supportive care discussed for cough including guaifenesin, Tessalon Perles as needed Return and ER precautions discussed with patient  The patient was given the opportunity to ask questions.  All questions answered to their satisfaction.  The patient is in agreement to this plan.   Final Clinical Impressions(s) / UC  Diagnoses   Final diagnoses:  Acute cough  Non-recurrent acute suppurative otitis media of right ear without spontaneous rupture of tympanic membrane     Discharge Instructions      You have an ear infection in your right ear. Take the Augmentin as prescribed to treat it.  Your other symptoms are consistent with a viral infection and should improve over the next few days. If you develop chest pain or shortness of breath, go to the emergency room.  Your COVID-19 and influenza test is negative today.  Some things that can make you feel better are: - Increased rest - Increasing fluid with water/sugar free electrolytes - Acetaminophen and ibuprofen as needed for fever/pain - Salt water gargling, chloraseptic spray and throat lozenges - OTC guaifenesin (Mucinex) 600 mg twice daily - Saline sinus flushes or a neti pot - Humidifying the air -Tessalon Perles every 8 hours as needed for dry cough      ED Prescriptions     Medication Sig Dispense Auth. Provider   amoxicillin-clavulanate (AUGMENTIN) 875-125 MG tablet Take 1 tablet by mouth 2 (two) times daily for 7 days. 14 tablet Cathlean Marseilles A, NP   benzonatate (TESSALON) 100 MG capsule Take 1 capsule (100 mg total) by mouth 3 (three) times daily as needed for cough. Do not take with alcohol or while driving or operating heavy machinery.  May cause drowsiness. 21 capsule Valentino Nose, NP      PDMP not reviewed this encounter.   Valentino Nose, NP 11/11/23 [phone]

## 2023-11-23 DIAGNOSIS — R059 Cough, unspecified: Secondary | ICD-10-CM | POA: Diagnosis not present

## 2023-11-23 DIAGNOSIS — J302 Other seasonal allergic rhinitis: Secondary | ICD-10-CM | POA: Diagnosis not present

## 2023-11-23 DIAGNOSIS — H6591 Unspecified nonsuppurative otitis media, right ear: Secondary | ICD-10-CM | POA: Diagnosis not present

## 2023-11-30 DIAGNOSIS — E782 Mixed hyperlipidemia: Secondary | ICD-10-CM | POA: Diagnosis not present

## 2023-11-30 DIAGNOSIS — R7303 Prediabetes: Secondary | ICD-10-CM | POA: Diagnosis not present

## 2023-12-04 DIAGNOSIS — R7301 Impaired fasting glucose: Secondary | ICD-10-CM | POA: Diagnosis not present

## 2023-12-04 DIAGNOSIS — K219 Gastro-esophageal reflux disease without esophagitis: Secondary | ICD-10-CM | POA: Diagnosis not present

## 2023-12-04 DIAGNOSIS — J302 Other seasonal allergic rhinitis: Secondary | ICD-10-CM | POA: Diagnosis not present

## 2023-12-04 DIAGNOSIS — Z0000 Encounter for general adult medical examination without abnormal findings: Secondary | ICD-10-CM | POA: Diagnosis not present

## 2023-12-04 DIAGNOSIS — E782 Mixed hyperlipidemia: Secondary | ICD-10-CM | POA: Diagnosis not present

## 2023-12-04 DIAGNOSIS — R7303 Prediabetes: Secondary | ICD-10-CM | POA: Diagnosis not present

## 2023-12-04 DIAGNOSIS — D509 Iron deficiency anemia, unspecified: Secondary | ICD-10-CM | POA: Diagnosis not present

## 2023-12-04 DIAGNOSIS — M85852 Other specified disorders of bone density and structure, left thigh: Secondary | ICD-10-CM | POA: Diagnosis not present

## 2023-12-04 DIAGNOSIS — M25472 Effusion, left ankle: Secondary | ICD-10-CM | POA: Diagnosis not present

## 2023-12-04 DIAGNOSIS — M7732 Calcaneal spur, left foot: Secondary | ICD-10-CM | POA: Diagnosis not present

## 2023-12-04 DIAGNOSIS — I10 Essential (primary) hypertension: Secondary | ICD-10-CM | POA: Diagnosis not present

## 2023-12-04 DIAGNOSIS — E669 Obesity, unspecified: Secondary | ICD-10-CM | POA: Diagnosis not present

## 2023-12-04 DIAGNOSIS — R945 Abnormal results of liver function studies: Secondary | ICD-10-CM | POA: Diagnosis not present

## 2023-12-07 NOTE — Telephone Encounter (Signed)
 Patient calling, has follow up with oncology on 12/14/2023 and with Dr Celia on 12/15/2023.  But her mammogram is not scheduled until 01/06/2024.  Patient states Dr Celia wanted her to follow up with her after this mammogram.  Appointment moved to May 6 with Dr Celia for breast cancer follow up.

## 2023-12-09 ENCOUNTER — Other Ambulatory Visit (HOSPITAL_COMMUNITY): Payer: Self-pay | Admitting: Internal Medicine

## 2023-12-09 DIAGNOSIS — M85852 Other specified disorders of bone density and structure, left thigh: Secondary | ICD-10-CM

## 2023-12-14 DIAGNOSIS — C50212 Malignant neoplasm of upper-inner quadrant of left female breast: Secondary | ICD-10-CM | POA: Diagnosis not present

## 2023-12-14 DIAGNOSIS — Z170 Estrogen receptor positive status [ER+]: Secondary | ICD-10-CM | POA: Diagnosis not present

## 2023-12-14 NOTE — Progress Notes (Signed)
 [facility], [facility]  Hematology Oncology Consultation  Followup Visit Note  Patient Name: Carrie Mayer Patient Age: 78 y.o. Encounter Date: 12/14/2023  Referring Physician:  Shona Norleen Folk, MD [address] [place],  [place] [address]  Consulting Provider: Mertie Been Letty Raddle., MD  Hematology/Oncology  Reason for Visit: Follow-up breast cancer.   Assessment/Plan: Carrie Mayer is a 78 year old lady referred to us  for evaluation of left breast cancer on 12/24/2022. She appeared to have a very early stage breast cancer (0.5 cm tumor) with good prognostic features such as ER positive at 100%, PR positive at 95% and HER2 negative.    She underwent a left breast lumpectomy on 12/31/2022.  No lymph node staging was done.  She declined adjuvant radiation therapy.  She has been placed on adjuvant hormonal therapy with Femara on 01/21/2023.  Her bone density test disclosed osteopenia.  She has been taking calcium  and vitamin D.  She declined any bisphosphonate.    Cancer Staging <redacted file path>  Malignant neoplasm of upper-inner quadrant of left breast in female, estrogen receptor positive Staging form: Breast, AJCC 8th Edition - Pathologic stage from 12/30/2022: Stage Unknown (pT1b, pNX, cM0, G2, ER+, PR+, HER2-) - Signed by Celia Duwaine Sauer, MD on 01/15/2023 - Clinical stage from 12/31/2022: Stage IA (cT1b, cN0, cM0, G2, ER+, PR+, HER2-) - Signed by Dannielle Alm Locus, MD on 01/21/2023   I have reviewed the laboratory, pathology, and radiology reports in detail and discussed findings with the patient.  Interval History:   08/10/2023 - The patient has no significant complaints.  She is tolerating Femara well.  She will remain under surveillance and she will return for reevaluation in 4 months.    12/14/2023 - Mr. Zendejas continues to do well without any significant problems.  She will remain on Femara and return for  surveillance in 4 months.   Review of Systems  Constitutional: Negative.   HENT:  Negative.    Eyes: Negative.   Respiratory: Negative.    Cardiovascular: Negative.   Gastrointestinal: Negative.   Endocrine: Negative.   Genitourinary: Negative.    Musculoskeletal: Negative.   Skin: Negative.   Hematological: Negative.     Vital signs for this encounter: BSA: 1.77 meters squared BP 156/88   Pulse 81   Temp 36.3 C (97.4 F) (Temporal)   Resp 16   Wt 70.5 kg (155 lb 6.4 oz)   SpO2 95%   BMI 27.53 kg/m   Physical Exam Constitutional:      Appearance: Normal appearance.  HENT:     Head: Normocephalic.     Nose: Nose normal.     Mouth/Throat:     Mouth: Mucous membranes are moist.     Pharynx: Oropharynx is clear.  Eyes:     Extraocular Movements: Extraocular movements intact.     Conjunctiva/sclera: Conjunctivae normal.     Pupils: Pupils are equal, round, and reactive to light.  Pulmonary:     Breath sounds: Normal breath sounds.  Abdominal:     General: Abdomen is flat. Bowel sounds are normal.     Palpations: Abdomen is soft.  Musculoskeletal:        General: Normal range of motion.     Cervical back: Neck supple.  Skin:    General: Skin is warm.  Neurological:     General: No focal deficit present.     Mental Status: She is alert.     Karnofsky/Lansky Performance Status  90,  Able to carry on normal activity; minor signs or symptoms of disease (ECOG equivalent 0)   Results:  WBC  Date Value Ref Range Status  07/20/2023 7.5 4.0 - 10.5 10*9/L Final   HGB  Date Value Ref Range Status  07/20/2023 13.0 11.5 - 15.0 g/dL Final   HCT  Date Value Ref Range Status  07/20/2023 38.2 34.0 - 44.0 % Final   Platelet  Date Value Ref Range Status  07/20/2023 263 140 - 415 10*9/L Final   Creatinine  Date Value Ref Range Status  07/20/2023 0.67 0.60 - 1.10 mg/dL Final   AST  Date Value Ref Range Status  07/20/2023 20 15 - 40 U/L Final

## 2023-12-14 NOTE — Progress Notes (Signed)
 Patient in clinic today follow up of breast cancer. Currently taking Femara. Weight and vitals stable. Denies pain. No other concerns noted. MD aware.   Will schedule any tests or procedures ordered by provider. Will arrange any referrals as indicated. MyChart active.

## 2023-12-24 DIAGNOSIS — D225 Melanocytic nevi of trunk: Secondary | ICD-10-CM | POA: Diagnosis not present

## 2023-12-24 DIAGNOSIS — Z1283 Encounter for screening for malignant neoplasm of skin: Secondary | ICD-10-CM | POA: Diagnosis not present

## 2024-01-05 ENCOUNTER — Ambulatory Visit (HOSPITAL_COMMUNITY)
Admission: RE | Admit: 2024-01-05 | Discharge: 2024-01-05 | Disposition: A | Discharge status: Home / Self Care | Source: Ambulatory Visit | Attending: Internal Medicine | Admitting: Internal Medicine

## 2024-01-05 DIAGNOSIS — M85852 Other specified disorders of bone density and structure, left thigh: Secondary | ICD-10-CM | POA: Insufficient documentation

## 2024-01-06 ENCOUNTER — Ambulatory Visit (INDEPENDENT_AMBULATORY_CARE_PROVIDER_SITE_OTHER): Payer: Medicare HMO | Admitting: Otolaryngology

## 2024-01-06 VITALS — BP 144/80 | HR 78 | Ht 62.0 in | Wt 155.0 lb

## 2024-01-06 DIAGNOSIS — H6983 Other specified disorders of Eustachian tube, bilateral: Secondary | ICD-10-CM | POA: Diagnosis not present

## 2024-01-06 DIAGNOSIS — J310 Chronic rhinitis: Secondary | ICD-10-CM | POA: Diagnosis not present

## 2024-01-06 DIAGNOSIS — R0981 Nasal congestion: Secondary | ICD-10-CM | POA: Diagnosis not present

## 2024-01-06 DIAGNOSIS — H903 Sensorineural hearing loss, bilateral: Secondary | ICD-10-CM | POA: Diagnosis not present

## 2024-01-07 DIAGNOSIS — H903 Sensorineural hearing loss, bilateral: Secondary | ICD-10-CM | POA: Insufficient documentation

## 2024-01-07 DIAGNOSIS — H6983 Other specified disorders of Eustachian tube, bilateral: Secondary | ICD-10-CM | POA: Insufficient documentation

## 2024-01-07 DIAGNOSIS — J310 Chronic rhinitis: Secondary | ICD-10-CM | POA: Insufficient documentation

## 2024-01-07 NOTE — Progress Notes (Signed)
 Patient ID: Carrie Mayer, female   DOB: [birth year], 78 y.o.   MRN: [id number]  Follow-up: Bilateral hearing loss  HPI: The patient is a 78 year old female who returns today for her follow-up evaluation.  She was last seen in August 2024.  At that time, she was complaining of bilateral hearing loss and clogging sensation in her ears.  She was noted to have bilateral high-frequency sensorineural hearing loss and bilateral eustachian tube dysfunction.  She was treated with Flonase nasal spray and bilateral hearing aids.  The patient returns today complaining of persistent bilateral hearing difficulty.  She continues to have nasal congestion and eustachian tube dysfunction.  She is using Flonase daily.  Currently she denies any otalgia, otorrhea, facial pain, or fever.  Exam: General: Communicates without difficulty, well nourished, no acute distress. Head: Normocephalic, no evidence injury, no tenderness, facial buttresses intact without stepoff. Face/sinus: No tenderness to palpation and percussion. Facial movement is normal and symmetric. Eyes: PERRL, EOMI. No scleral icterus, conjunctivae clear. Neuro: CN II exam reveals vision grossly intact.  No nystagmus at any point of gaze. Ears: Auricles well formed without lesions.  Ear canals are intact without mass or lesion.  No erythema or edema is appreciated.  The TMs are intact without fluid. Nose: External evaluation reveals normal support and skin without lesions.  Dorsum is intact.  Anterior rhinoscopy reveals congested mucosa over anterior aspect of inferior turbinates and intact septum.  No purulence noted. Oral:  Oral cavity and oropharynx are intact, symmetric, without erythema or edema.  Mucosa is moist without lesions. Neck: Full range of motion without pain.  There is no significant lymphadenopathy.  No masses palpable.  Thyroid  bed within normal limits to palpation.  Parotid glands and submandibular glands equal bilaterally without mass.  Trachea  is midline. Neuro:  CN 2-12 grossly intact.   Assessment: 1.  Chronic rhinitis with nasal mucosal congestion. 2.  Bilateral eustachian tube dysfunction. 3.  Subjectively stable bilateral high-frequency sensorineural hearing loss.  Plan: 1.  The physical exam findings are reviewed with the patient. 2.  The patient is reassured that no infection is noted today. 3.  Continue with Flonase nasal spray 2 sprays each nostril daily. 4.  The patient will return for reevaluation in 1 year.

## 2024-01-11 DIAGNOSIS — Z853 Personal history of malignant neoplasm of breast: Secondary | ICD-10-CM | POA: Diagnosis not present

## 2024-01-11 DIAGNOSIS — C50412 Malignant neoplasm of upper-outer quadrant of left female breast: Secondary | ICD-10-CM | POA: Diagnosis not present

## 2024-01-11 DIAGNOSIS — Z170 Estrogen receptor positive status [ER+]: Secondary | ICD-10-CM | POA: Diagnosis not present

## 2024-01-11 DIAGNOSIS — Z08 Encounter for follow-up examination after completed treatment for malignant neoplasm: Secondary | ICD-10-CM | POA: Diagnosis not present

## 2024-01-12 DIAGNOSIS — Z170 Estrogen receptor positive status [ER+]: Secondary | ICD-10-CM | POA: Diagnosis not present

## 2024-01-12 DIAGNOSIS — C50212 Malignant neoplasm of upper-inner quadrant of left female breast: Secondary | ICD-10-CM | POA: Diagnosis not present

## 2024-04-13 NOTE — Progress Notes (Signed)
 [facility], [facility]  Hematology Oncology Consultation  Followup Visit Note  Patient Name: Carrie Mayer Patient Age: 78 y.o. Encounter Date: 04/14/2024  Referring Physician:  Letty Carder Bobette Raddle., MD [address] [place],  [place] [address]  Consulting Provider: Carder Bobette Letty Raddle., MD  Hematology/Oncology  Reason for Visit: Follow-up breast cancer.   Assessment/Plan: Carrie Mayer is a 78 year old lady referred to us  for evaluation of left breast cancer on 12/24/2022. She appeared to have a very early stage breast cancer (0.5 cm tumor) with good prognostic features such as ER positive at 100%, PR positive at 95% and HER2 negative.    She underwent a left breast lumpectomy on 12/31/2022.  No lymph node staging was done in accordance with Choosing Wisely and the SOUND Trial.  She declined adjuvant radiation therapy.  She has been placed on adjuvant hormonal therapy with Femara on 01/21/2023.  Her bone density test disclosed osteopenia.  She has been taking calcium  and vitamin D.  She declined any bisphosphonate.    Cancer Staging <redacted file path>  Malignant neoplasm of upper-inner quadrant of left breast in female, estrogen receptor positive    Staging form: Breast, AJCC 8th Edition - Pathologic stage from 12/30/2022: Stage Unknown (pT1b, pNX, cM0, G2, ER+, PR+, HER2-) - Signed by Celia Duwaine Sauer, MD on 01/15/2023 - Clinical stage from 12/31/2022: Stage IA (cT1b, cN0, cM0, G2, ER+, PR+, HER2-) - Signed by Dannielle Alm Locus, MD on 01/21/2023   I have reviewed the laboratory, pathology, and radiology reports in detail and discussed findings with the patient.  Interval History:   08/10/2023 - The patient has no significant complaints.  She is tolerating Femara well.  She will remain under surveillance and she will return for reevaluation in 4 months.    12/14/2023 - Carrie Mayer continues to do well without any significant problems.   She will remain on Femara and return for surveillance in 4 months.   04/14/2024 - The patient is clinically stable although she has fatigue. She is tolerating Femara otherwise reasonably well so far. She is being followed closely by Surgery (Dr. Celia). She will return for reevaluation in 4 months.   Review of Systems  Constitutional:  Positive for fatigue.  HENT:  Negative.    Eyes: Negative.   Respiratory: Negative.    Cardiovascular: Negative.   Gastrointestinal: Negative.   Endocrine: Negative.   Genitourinary: Negative.    Musculoskeletal: Negative.   Skin: Negative.   Hematological: Negative.     Vital signs for this encounter: BSA: 1.79 meters squared BP 160/94   Pulse 50   Temp 36.7 C (98 F) (Temporal)   Resp 16   Wt 72.4 kg (159 lb 9.6 oz)   SpO2 97%   BMI 28.27 kg/m   Physical Exam Constitutional:      Appearance: Normal appearance.  HENT:     Head: Normocephalic.     Nose: Nose normal.     Mouth/Throat:     Mouth: Mucous membranes are moist.     Pharynx: Oropharynx is clear.  Eyes:     Extraocular Movements: Extraocular movements intact.     Conjunctiva/sclera: Conjunctivae normal.     Pupils: Pupils are equal, round, and reactive to light.  Pulmonary:     Breath sounds: Normal breath sounds.  Abdominal:     General: Abdomen is flat. Bowel sounds are normal.     Palpations: Abdomen is soft.  Musculoskeletal:  General: Normal range of motion.     Cervical back: Neck supple.  Skin:    General: Skin is warm.  Neurological:     General: No focal deficit present.     Mental Status: She is alert.     Karnofsky/Lansky Performance Status 90,  Able to carry on normal activity; minor signs or symptoms of disease (ECOG equivalent 0)   Results:  WBC  Date Value Ref Range Status  07/20/2023 7.5 4.0 - 10.5 10*9/L Final   HGB  Date Value Ref Range Status  07/20/2023 13.0 11.5 - 15.0 g/dL Final   HCT  Date Value Ref Range Status   07/20/2023 38.2 34.0 - 44.0 % Final   Platelet  Date Value Ref Range Status  07/20/2023 263 140 - 415 10*9/L Final   Creatinine  Date Value Ref Range Status  07/20/2023 0.67 0.60 - 1.10 mg/dL Final   AST  Date Value Ref Range Status  07/20/2023 20 15 - 40 U/L Final

## 2024-04-14 DIAGNOSIS — Z170 Estrogen receptor positive status [ER+]: Secondary | ICD-10-CM | POA: Diagnosis not present

## 2024-04-14 DIAGNOSIS — C50212 Malignant neoplasm of upper-inner quadrant of left female breast: Secondary | ICD-10-CM | POA: Diagnosis not present

## 2024-04-14 NOTE — Progress Notes (Signed)
 Patient in today for a follow up on breast cancer.  Patient arrived to the clinic and is ambulatory and without any gait issues observed,   Patient vital signs and weight obtained and are stable.  Patient reports no problems today other than fatigue which she states is her age p[robably.  Denies any additional questions or concerns for the nurse at this time but is aware a call to the clinic is always an option should the need arise.   Update provided to MD. RN will assist with referrals, test or procedures as ordered by the provider.

## 2024-05-31 DIAGNOSIS — E782 Mixed hyperlipidemia: Secondary | ICD-10-CM | POA: Diagnosis not present

## 2024-05-31 DIAGNOSIS — R7303 Prediabetes: Secondary | ICD-10-CM | POA: Diagnosis not present

## 2024-06-06 ENCOUNTER — Inpatient Hospital Stay (HOSPITAL_COMMUNITY): Admission: EM | Admit: 2024-06-06 | Discharge: 2024-06-08 | DRG: 103 | Disposition: A | Discharge status: Home Health

## 2024-06-06 ENCOUNTER — Emergency Department (HOSPITAL_COMMUNITY)

## 2024-06-06 ENCOUNTER — Other Ambulatory Visit: Payer: Self-pay

## 2024-06-06 ENCOUNTER — Inpatient Hospital Stay (HOSPITAL_COMMUNITY)

## 2024-06-06 ENCOUNTER — Encounter (HOSPITAL_COMMUNITY): Payer: Self-pay

## 2024-06-06 DIAGNOSIS — G319 Degenerative disease of nervous system, unspecified: Secondary | ICD-10-CM | POA: Diagnosis not present

## 2024-06-06 DIAGNOSIS — Z803 Family history of malignant neoplasm of breast: Secondary | ICD-10-CM

## 2024-06-06 DIAGNOSIS — K219 Gastro-esophageal reflux disease without esophagitis: Secondary | ICD-10-CM | POA: Diagnosis present

## 2024-06-06 DIAGNOSIS — E785 Hyperlipidemia, unspecified: Secondary | ICD-10-CM | POA: Diagnosis present

## 2024-06-06 DIAGNOSIS — Z7982 Long term (current) use of aspirin: Secondary | ICD-10-CM

## 2024-06-06 DIAGNOSIS — I6381 Other cerebral infarction due to occlusion or stenosis of small artery: Secondary | ICD-10-CM | POA: Diagnosis not present

## 2024-06-06 DIAGNOSIS — I6782 Cerebral ischemia: Secondary | ICD-10-CM | POA: Diagnosis not present

## 2024-06-06 DIAGNOSIS — I639 Cerebral infarction, unspecified: Principal | ICD-10-CM | POA: Diagnosis present

## 2024-06-06 DIAGNOSIS — R4701 Aphasia: Secondary | ICD-10-CM | POA: Diagnosis not present

## 2024-06-06 DIAGNOSIS — Z79899 Other long term (current) drug therapy: Secondary | ICD-10-CM

## 2024-06-06 DIAGNOSIS — Z8673 Personal history of transient ischemic attack (TIA), and cerebral infarction without residual deficits: Secondary | ICD-10-CM | POA: Diagnosis not present

## 2024-06-06 DIAGNOSIS — G43109 Migraine with aura, not intractable, without status migrainosus: Secondary | ICD-10-CM | POA: Diagnosis not present

## 2024-06-06 DIAGNOSIS — Z90710 Acquired absence of both cervix and uterus: Secondary | ICD-10-CM | POA: Diagnosis not present

## 2024-06-06 DIAGNOSIS — I498 Other specified cardiac arrhythmias: Secondary | ICD-10-CM

## 2024-06-06 DIAGNOSIS — Z79811 Long term (current) use of aromatase inhibitors: Secondary | ICD-10-CM | POA: Diagnosis not present

## 2024-06-06 DIAGNOSIS — R4182 Altered mental status, unspecified: Secondary | ICD-10-CM | POA: Diagnosis not present

## 2024-06-06 DIAGNOSIS — I728 Aneurysm of other specified arteries: Secondary | ICD-10-CM | POA: Diagnosis present

## 2024-06-06 DIAGNOSIS — I493 Ventricular premature depolarization: Secondary | ICD-10-CM

## 2024-06-06 DIAGNOSIS — I10 Essential (primary) hypertension: Secondary | ICD-10-CM | POA: Diagnosis not present

## 2024-06-06 DIAGNOSIS — Z791 Long term (current) use of non-steroidal anti-inflammatories (NSAID): Secondary | ICD-10-CM

## 2024-06-06 DIAGNOSIS — Z853 Personal history of malignant neoplasm of breast: Secondary | ICD-10-CM | POA: Diagnosis not present

## 2024-06-06 DIAGNOSIS — R29818 Other symptoms and signs involving the nervous system: Secondary | ICD-10-CM | POA: Diagnosis not present

## 2024-06-06 DIAGNOSIS — I517 Cardiomegaly: Secondary | ICD-10-CM

## 2024-06-06 DIAGNOSIS — Z743 Need for continuous supervision: Secondary | ICD-10-CM | POA: Diagnosis not present

## 2024-06-06 DIAGNOSIS — G4489 Other headache syndrome: Secondary | ICD-10-CM | POA: Diagnosis not present

## 2024-06-06 DIAGNOSIS — R410 Disorientation, unspecified: Secondary | ICD-10-CM | POA: Diagnosis not present

## 2024-06-06 DIAGNOSIS — G459 Transient cerebral ischemic attack, unspecified: Secondary | ICD-10-CM | POA: Diagnosis not present

## 2024-06-06 DIAGNOSIS — R0902 Hypoxemia: Secondary | ICD-10-CM | POA: Diagnosis not present

## 2024-06-06 DIAGNOSIS — G4089 Other seizures: Secondary | ICD-10-CM | POA: Diagnosis present

## 2024-06-06 DIAGNOSIS — R569 Unspecified convulsions: Secondary | ICD-10-CM | POA: Diagnosis not present

## 2024-06-06 HISTORY — DX: Malignant (primary) neoplasm, unspecified: C80.1

## 2024-06-06 HISTORY — DX: Other specified disorders of bone density and structure, unspecified site: M85.80

## 2024-06-06 LAB — RAPID URINE DRUG SCREEN, HOSP PERFORMED
Amphetamines: NOT DETECTED
Barbiturates: NOT DETECTED
Benzodiazepines: NOT DETECTED
Cocaine: NOT DETECTED
Opiates: NOT DETECTED
Tetrahydrocannabinol: NOT DETECTED

## 2024-06-06 LAB — COMPREHENSIVE METABOLIC PANEL WITH GFR
ALT: 20 U/L (ref 0–44)
AST: 20 U/L (ref 15–41)
Albumin: 4 g/dL (ref 3.5–5.0)
Alkaline Phosphatase: 76 U/L (ref 38–126)
Anion gap: 14 (ref 5–15)
BUN: 18 mg/dL (ref 8–23)
CO2: 22 mmol/L (ref 22–32)
Calcium: 9.2 mg/dL (ref 8.9–10.3)
Chloride: 94 mmol/L — ABNORMAL LOW (ref 98–111)
Creatinine, Ser: 0.5 mg/dL (ref 0.44–1.00)
GFR, Estimated: >60 mL/min (ref >60)
Glucose, Bld: 95 mg/dL (ref 70–99)
Potassium: 3.8 mmol/L (ref 3.5–5.1)
Sodium: 130 mmol/L — ABNORMAL LOW (ref 135–145)
Total Bilirubin: 0.7 mg/dL (ref 0.0–1.2)
Total Protein: 6.8 g/dL (ref 6.5–8.1)

## 2024-06-06 LAB — DIFFERENTIAL
Abs Immature Granulocytes: 0.03 K/uL (ref 0.00–0.07)
Basophils Absolute: 0 K/uL (ref 0.0–0.1)
Basophils Relative: 1 %
Eosinophils Absolute: 0.2 K/uL (ref 0.0–0.5)
Eosinophils Relative: 3 %
Immature Granulocytes: 0 %
Lymphocytes Relative: 25 %
Lymphs Abs: 2.1 K/uL (ref 0.7–4.0)
Monocytes Absolute: 0.6 K/uL (ref 0.1–1.0)
Monocytes Relative: 7 %
Neutro Abs: 5.4 K/uL (ref 1.7–7.7)
Neutrophils Relative %: 64 %

## 2024-06-06 LAB — CBC
HCT: 39.4 % (ref 36.0–46.0)
Hemoglobin: 13.5 g/dL (ref 12.0–15.0)
MCH: 29.2 pg (ref 26.0–34.0)
MCHC: 34.3 g/dL (ref 30.0–36.0)
MCV: 85.3 fL (ref 80.0–100.0)
Platelets: 300 K/uL (ref 150–400)
RBC: 4.62 MIL/uL (ref 3.87–5.11)
RDW: 13.4 % (ref 11.5–15.5)
WBC: 8.4 K/uL (ref 4.0–10.5)
nRBC: 0 % (ref 0.0–0.2)

## 2024-06-06 LAB — APTT: aPTT: 27 s (ref 24–36)

## 2024-06-06 LAB — ETHANOL: Alcohol, Ethyl (B): <15 mg/dL (ref <15)

## 2024-06-06 LAB — HEMOGLOBIN A1C
Hgb A1c MFr Bld: 5.9 % — ABNORMAL HIGH (ref 4.8–5.6)
Mean Plasma Glucose: 122.63 mg/dL

## 2024-06-06 LAB — CBG MONITORING, ED: Glucose-Capillary: 98 mg/dL (ref 70–99)

## 2024-06-06 LAB — PROTIME-INR
INR: 0.9 (ref 0.8–1.2)
Prothrombin Time: 12.8 s (ref 11.4–15.2)

## 2024-06-06 LAB — MRSA NEXT GEN BY PCR, NASAL: MRSA by PCR Next Gen: NOT DETECTED

## 2024-06-06 MED ORDER — ORAL CARE MOUTH RINSE: 15.0000 mL | OROMUCOSAL | Status: DC | PRN

## 2024-06-06 MED ORDER — CHLORHEXIDINE GLUCONATE CLOTH 2 % EX PADS
6.0000 | MEDICATED_PAD | Freq: Every day | CUTANEOUS | Status: DC
  Administered 2024-06-06 – 2024-06-07 (×2): 6 via TOPICAL

## 2024-06-06 MED ORDER — TENECTEPLASE FOR STROKE
0.2500 mg/kg | PACK | Freq: Once | INTRAVENOUS | Status: AC
  Administered 2024-06-06: 18 mg via INTRAVENOUS

## 2024-06-06 MED ORDER — TENECTEPLASE FOR STROKE
PACK | INTRAVENOUS | Status: AC
  Filled 2024-06-06: qty 10

## 2024-06-06 MED ORDER — ACETAMINOPHEN 650 MG RE SUPP: 650.0000 mg | RECTAL | Status: DC | PRN

## 2024-06-06 MED ORDER — LABETALOL HCL 5 MG/ML IV SOLN: 20.0000 mg | Freq: Once | INTRAVENOUS | Status: DC

## 2024-06-06 MED ORDER — STROKE: EARLY STAGES OF RECOVERY BOOK
Freq: Once | Status: AC
  Filled 2024-06-06: qty 1

## 2024-06-06 MED ORDER — SODIUM CHLORIDE 0.9 % IV SOLN: INTRAVENOUS | Status: DC

## 2024-06-06 MED ORDER — ACETAMINOPHEN 325 MG PO TABS
650.0000 mg | ORAL_TABLET | ORAL | Status: DC | PRN
  Administered 2024-06-07 (×2): 650 mg via ORAL
  Filled 2024-06-06 (×2): qty 2

## 2024-06-06 MED ORDER — SENNOSIDES-DOCUSATE SODIUM 8.6-50 MG PO TABS: 1.0000 | ORAL_TABLET | Freq: Every evening | ORAL | Status: DC | PRN

## 2024-06-06 MED ORDER — CLEVIDIPINE BUTYRATE 0.5 MG/ML IV EMUL: 0.0000 mg/h | INTRAVENOUS | Status: DC

## 2024-06-06 MED ORDER — PANTOPRAZOLE SODIUM 40 MG IV SOLR
40.0000 mg | Freq: Every day | INTRAVENOUS | Status: DC
Start: 2024-06-06 — End: 2024-06-07

## 2024-06-06 MED ORDER — IOHEXOL 350 MG/ML SOLN
75.0000 mL | Freq: Once | INTRAVENOUS | Status: AC | PRN
  Administered 2024-06-06: 75 mL via INTRAVENOUS

## 2024-06-06 MED ORDER — ACETAMINOPHEN 160 MG/5ML PO SOLN: 650.0000 mg | ORAL | Status: DC | PRN

## 2024-06-06 MED ORDER — GADOBUTROL 1 MMOL/ML IV SOLN
7.0000 mL | Freq: Once | INTRAVENOUS | Status: AC | PRN
  Administered 2024-06-06: 7 mL via INTRAVENOUS

## 2024-06-06 NOTE — H&P (Signed)
 NEUROLOGY H&P NOTE   Date of service: June 06, 2024 Patient Name: Carrie Mayer MRN:  [id number] DOB:  April 30, 1946 Chief Complaint: Confusion, aphasia, visual disturbance  History of Present Illness  Carrie Mayer is a 78 y.o. female with hx of migraines, breast cancer (clinical stage IA, pathologic stage unknown, s/p L breast lumpectomy 12/2022, adjuvant RT declined, on adjuvant HT with Femara as of 01/21/23), osteopenia, HLD, GERD, diverticulitis who presented to [facility] with sudden onset of confusion, aphasia, visual disturbance in the setting of headache.  Last known well: 1140am on 06/06/24 Pre-Modified rankin score: 0 IV Thrombolysis: Yes (TNK @ 1349 on 06/06/24) Thrombectomy: No (no LVO on CTA) ICH Score: N/A  As per Teleneurology note: NIHSS components Score: Comment  1a Level of Conscious 0[]  1[]  2[]  3[]      1b LOC Questions 0[]  1[]  2[]       1c LOC Commands 0[]  1[x]  2[]       2 Best Gaze 0[]  1[]  2[]       3 Visual 0[]  1[]  2[]  3[]      4 Facial Palsy 0[]  1[]  2[]  3[]      5a Motor Arm - left 0[]  1[]  2[]  3[]  4[]  UN[]    5b Motor Arm - Right 0[]  1[]  2[]  3[]  4[]  UN[]    6a Motor Leg - Left 0[]  1[]  2[]  3[]  4[]  UN[]    6b Motor Leg - Right 0[]  1[]  2[]  3[]  4[]  UN[]    7 Limb Ataxia 0[]  1[]  2[]  UN[]      8 Sensory 0[]  1[]  2[]  UN[]      9 Best Language 0[]  1[x]  2[]  3[]      10 Dysarthria 0[]  1[]  2[]  UN[]      11 Extinct. and Inattention 0[]  1[]  2[]       TOTAL: 2    Interview with patient does require hearing aids although she can read lips.   As per patient, she was her normal self until she noticed some visual disturbance (unable to see parts of her vision) suddenly around noon while at work. She also had concomitant onset of a headache. She went to try to use the microwave to heat up her lunch but was unable to figure it out, so she drove to [facility]. However, because the parking lot was so packed, she assumed the ED was busy and that she should get some food since  she will likely be unable to eat anything once in the ED. She drove to [employer] but was unable to figure out how to order at the drive through, so she headed back to the ED. She reports it was difficult to find a parking spot, even noting that there was a man who headed back to his car but ended up just sitting there instead of leaving. Finally, she was able to find a spot, and when walking into the ED, did not notice any numbness or weakness. She was unable to express herself to describe her symptoms.   As per Teleneurology notes, attempts to reach next of kin unsuccessful. Therefore decision made amongst physicians to give TNK.   She reports her symptoms were stable (no worsening or new symptoms) up until a few hours after she received TNK when she began to get better. Currently endorses some mild word finding difficulty and headache.  Upon further questioning, she reports she has had similar visual disturbances before with prior headaches (multiple). She has a history of migraines but they had started recurring again after starting her hormonal therapy, Femara. The headache she feels  at onset of symptoms and now is similar to before.     ROS  Comprehensive ROS performed and pertinent positives documented in the HPI  Past History   Past Medical History:  Diagnosis Date   Anemia    Arthritis    Cancer (HCC)    left breast   Diverticulitis    GERD (gastroesophageal reflux disease)    Hyperlipidemia    Osteopenia    Past Surgical History:  Procedure Laterality Date   ABDOMINAL HYSTERECTOMY     CATARACT EXTRACTION W/PHACO Left 02/01/2015   Procedure: CATARACT EXTRACTION PHACO AND INTRAOCULAR LENS PLACEMENT (IOC);  Surgeon: Cherene Mania, MD;  Location: [facility];  Service: Ophthalmology;  Laterality: Left;  CDE 6.48   CATARACT EXTRACTION W/PHACO Right 03/01/2015   Procedure: CATARACT EXTRACTION PHACO AND INTRAOCULAR LENS PLACEMENT RIGHT EYE; CDE: 4.09;  Surgeon: Cherene Mania, MD;  Location: [facility];   Service: Ophthalmology;  Laterality: Right;   HEMORROIDECTOMY     MYOMECTOMY  1976   Family History  Problem Relation Age of Onset   Breast cancer Mother    Social History   Socioeconomic History   Marital status: Single    Spouse name: Not on file   Number of children: Not on file   Years of education: Not on file   Highest education level: Not on file  Occupational History   Not on file  Tobacco Use   Smoking status: Never   Smokeless tobacco: Never  Substance and Sexual Activity   Alcohol use: Yes    Alcohol/week: 1.0 standard drink of alcohol    Types: 1 Glasses of wine per week    Comment: Occasional   Drug use: No   Sexual activity: Not on file  Other Topics Concern   Not on file  Social History Narrative   Not on file   Social Drivers of Health   Financial Resource Strain: Low Risk  (12/24/2022)   Received from [facility]   Overall Financial Resource Strain (CARDIA)    Difficulty of Paying Living Expenses: Not hard at all  Food Insecurity: No Food Insecurity (12/24/2022)   Received from [facility]   Hunger Vital Sign    Within the past 12 months, you worried that your food would run out before you got the money to buy more.: Never true    Within the past 12 months, the food you bought just didn't last and you didn't have money to get more.: Never true  Transportation Needs: No Transportation Needs (12/18/2022)   Received from [facility]   PRAPARE - Transportation    Lack of Transportation (Medical): No    Lack of Transportation (Non-Medical): No  Physical Activity: Insufficiently Active (12/24/2022)   Received from [facility]   Exercise Vital Sign    On average, how many days per week do you engage in moderate to strenuous exercise (like a brisk walk)?: 4 days    On average, how many minutes do you engage in exercise at this level?: 30 min  Stress: No Stress Concern Present (12/24/2022)   Received from [facility]  of Occupational Health - Occupational Stress Questionnaire    Feeling of Stress : Only a little  Social Connections: Unknown (12/24/2022)   Received from [facility]   Social Connection and Isolation Panel    In a typical week, how many times do you talk on the phone with family, friends, or neighbors?: More  than three times a week    How often do you get together with friends or relatives?: Twice a week    How often do you attend church or religious services?: More than 4 times per year    Do you belong to any clubs or organizations such as church groups, unions, fraternal or athletic groups, or school groups?: Yes    How often do you attend meetings of the clubs or organizations you belong to?: More than 4 times per year    Marital Status: Not on file   Allergies  Allergen Reactions   Codeine Nausea And Vomiting    Medications   Current Facility-Administered Medications:    [START ON 06/07/2024]  stroke: early stages of recovery book, , Does not apply, Once, Lennix Rotundo M, MD   0.9 %  sodium chloride  infusion, , Intravenous, Continuous, Porter Nakama M, MD   acetaminophen (TYLENOL) tablet 650 mg, 650 mg, Oral, Q4H PRN **OR** acetaminophen (TYLENOL) 160 MG/5ML solution 650 mg, 650 mg, Per Tube, Q4H PRN **OR** acetaminophen (TYLENOL) suppository 650 mg, 650 mg, Rectal, Q4H PRN, Dimitrios Balestrieri M, MD   [START ON 06/07/2024] Chlorhexidine Gluconate Cloth 2 % PADS 6 each, 6 each, Topical, Q0600, Adriel Desrosier M, MD   labetalol (NORMODYNE) injection 20 mg, 20 mg, Intravenous, Once **AND** clevidipine (CLEVIPREX) infusion 0.5 mg/mL, 0-21 mg/hr, Intravenous, Continuous, Shamiah Kahler M, MD   Oral care mouth rinse, 15 mL, Mouth Rinse, PRN, Delsy Etzkorn M, MD   pantoprazole (PROTONIX) injection 40 mg, 40 mg, Intravenous, QHS, Julionna Marczak M, MD   senna-docusate (Senokot-S) tablet 1 tablet, 1 tablet, Oral, QHS PRN, Sallyann Normie HERO, MD   Vitals   Vitals:   06/06/24 1830 06/06/24 1930 06/06/24 1935 06/06/24 2000  BP: (!) 143/83  (!) 184/87  (!) 152/82  Pulse: 66 (!) 44  (!) 55  Resp: 14 18  (!) 21  Temp:  98.2 F (36.8 C)  98.2 F (36.8 C)  TempSrc:  Oral  Oral  SpO2: 94% 94%  93%  Weight:   72.7 kg      Body mass index is 29.31 kg/m.  Physical Exam   Constitutional: Appears well-developed and well-nourished.  Psych: Affect appropriate to situation.  Eyes: No scleral injection.  HENT: No OP obstruction.  Head: Normocephalic.  Cardiovascular: PVC's noted on monitor. Respiratory: Effort normal, non-labored breathing.  GI: No distention. Skin: WDI.   Neurologic Examination  *** Mental status: alert, oriented to person, place and time. Able to provide history. Speech: no dysarthria, word-finding difficulty, paraphasic errors. Cranial nerves: PERRL EOMI VF full Face sensation intact bilaterally. Face symmetric at rest and with activation. Hearing grossly intact. Palate elevation symmetric Tongue protrudes midline and has full range of motion. SCM's full strength bilaterally. Motor: Normal bulk and tone. No abnormal movements RUE: shoulder abduction 5/5, biceps 5/5, triceps 5/5, wrist flexion 5/5, wrist extension 5/5, hand grip 5/5 LUE: shoulder abduction 5/5, biceps 5/5, triceps 5/5, wrist flexion 5/5, wrist extension 5/5, hand grip 5/5 RLE: hip flexion 5/5, knee flexion 5/5, knee extension 5/5, ankle dorsiflexion 5/5, plantar flexion 5/5 LLE: hip flexion 5/5, knee flexion 5/5, knee extension 5/5, ankle dorsiflexion 5/5, plantar flexion 5/5 Sensory: Grossly intact to light touch throughout. Reflexes: DTR's deferred. Downgoing toes bilaterally. Coordination: FTN intact. HTS intact. Gait: deferred   Labs   CBC:  Recent Labs  Lab 06/06/24 1330  WBC 8.4  NEUTROABS 5.4  HGB 13.5  HCT 39.4  MCV 85.3  PLT 300  Basic Metabolic Panel:  Lab Results  Component Value Date   NA 130 (L) 06/06/2024   K 3.8 06/06/2024   CO2 22 06/06/2024   GLUCOSE 95 06/06/2024   BUN 18 06/06/2024    CREATININE 0.50 06/06/2024   CALCIUM  9.2 06/06/2024   GFRNONAA >60 06/06/2024   GFRAA >60 01/24/2015   Lipid Panel: No results found for: LDLCALC HgbA1c:  Lab Results  Component Value Date   HGBA1C 5.9 (H) 06/06/2024   Urine Drug Screen:     Component Value Date/Time   LABOPIA NONE DETECTED 06/06/2024 1810   COCAINSCRNUR NONE DETECTED 06/06/2024 1810   LABBENZ NONE DETECTED 06/06/2024 1810   AMPHETMU NONE DETECTED 06/06/2024 1810   THCU NONE DETECTED 06/06/2024 1810   LABBARB NONE DETECTED 06/06/2024 1810    Alcohol Level     Component Value Date/Time   ETH <15 06/06/2024 1330   INR  Lab Results  Component Value Date   INR 0.9 06/06/2024   APTT  Lab Results  Component Value Date   APTT 27 06/06/2024     CT Head without contrast(Personally reviewed): ***  CT angio Head and Neck with contrast(Personally reviewed): ***  MR Angio head without contrast and Carotid Duplex BL(Personally reviewed): ***  MRI Brain(Personally reviewed): ***  rEEG:  ***  Assessment   Dareth Andrew is a 78 y.o. female ***  Primary Diagnosis:  {Cerebral Infarction:22351}  Secondary Diagnosis: {Stroke Comorbidities:21266}  Recommendations  *** ______________________________________________________________________   Bonney Normie CHRISTELLA Sallyann, MD [facility]  Risks, benefits and alternatives of IVT discussed with patient and/or family and they agreed.*** CTH personally reviewed prior to TNK administration***

## 2024-06-06 NOTE — Consult Note (Signed)
 [facility] Telemedicine Consult   Requesting Provider: Dr. Freddi Consult Participants: Dr. RONAL Lav, John RN Location of the provider: [facility] Location of the patient: Emergency room at [facility]  This consult was provided via telemedicine with 2-way video and audio communication. The patient/family was informed that care would be provided in this way and agreed to receive care in this manner.   Chief Complaint: Feeling confused  HPI: 78 year old with past medical history of breast cancer on treatment with Femara, hyperlipidemia, presenting to the emergency department with complaints of confusion and on initial ED evaluation was found to have both mild receptive and expressive aphasia for which a code stroke was activated. She had no focal weakness. She was unable to provide reliable history due to her aphasia. Attempts were made to reach to family/emergency contact friend whose phone went straight to voicemail. According to information initially provided to the emergency department, last known well was 11:40 AM when she was at lunch with friends/coworkers   Past Medical History:  Diagnosis Date   Anemia    Arthritis    Cancer (HCC)    left breast   Diverticulitis    GERD (gastroesophageal reflux disease)    Hyperlipidemia    Osteopenia      Current Facility-Administered Medications:    tenecteplase (TNKASE) 50 MG injection for Stroke, , , ,    tenecteplase (TNKASE) injection for Stroke 18 mg, 0.25 mg/kg (Order-Specific), Intravenous, Once, Lav Isles, MD  Current Outpatient Medications:    aspirin EC 81 MG tablet, Take 81 mg by mouth daily., Disp: , Rfl:    benzonatate  (TESSALON ) 100 MG capsule, Take 1 capsule (100 mg total) by mouth 3 (three) times daily as needed for cough. Do not take with alcohol or while driving or operating heavy machinery.  May cause drowsiness., Disp: 21 capsule, Rfl: 0   fluticasone (FLONASE) 50 MCG/ACT nasal spray, Place 2 sprays into  both nostrils daily., Disp: , Rfl:    letrozole (FEMARA) 2.5 MG tablet, Take 2.5 mg by mouth daily., Disp: , Rfl:    losartan (COZAAR) 100 MG tablet, Take 100 mg by mouth daily., Disp: , Rfl:    meloxicam (MOBIC) 15 MG tablet, Take 15 mg by mouth daily., Disp: , Rfl:    montelukast (SINGULAIR) 10 MG tablet, Take 10 mg by mouth daily., Disp: , Rfl:    Multiple Vitamin (MULTIVITAMIN WITH MINERALS) TABS tablet, Take 1 tablet by mouth daily., Disp: , Rfl:    rosuvastatin (CRESTOR) 5 MG tablet, Take 5 mg by mouth daily., Disp: , Rfl:    UNABLE TO FIND, Med Name: Doterra Bone Supplement for calcium , Disp: , Rfl:    UNABLE TO FIND, Med Name: Doterra DigestZen softgels for heartburn, Disp: , Rfl:    UNABLE TO FIND, Med Name:Doterra TriEase Softgels for allergy relief, Disp: , Rfl:    UNABLE TO FIND, Med Name: Doterra PB Assist probiotic, Disp: , Rfl:    UNABLE TO FIND, Med Name: Doterra TerraZyme for gut health, Disp: , Rfl:    UNABLE TO FIND, Med Name: Doterra On Guard softgels immune support, Disp: , Rfl:    UNABLE TO FIND, Med Name: Doterra Mitro2Max for energy & stamina, Disp: , Rfl:     LKW: 11:40 AM IV thrombolysis given?:  Yes IR Thrombectomy? No, no ELVO Modified Rankin Scale: 0-Completely asymptomatic and back to baseline post- stroke Time of teleneurologist evaluation: 1330 hrs.  Exam: Vitals:   06/06/24 1323 06/06/24 1328  BP: (!) 169/128  Pulse: 75 72  Resp:  14  SpO2: 95% 94%   Blood pressure at the time of TNK-151/76 General: Awake alert in no distress Neurological exam Awake alert oriented to self, got her age correct, got the month correct, naming comprehension and repetition was mildly impaired.  Cranial nerves II to XII intact.  Motor examination with no deficits.  Sensory examination with no deficits   NIHSS 1A: Level of Consciousness - 0 1B: Ask Month and Age - 0 1C: 'Blink Eyes' & 'Squeeze Hands' - 1 2: Test Horizontal Extraocular Movements - 0 3: Test Visual  Fields - 0 4: Test Facial Palsy - 0 5A: Test Left Arm Motor Drift - 0 5B: Test Right Arm Motor Drift - 0 6A: Test Left Leg Motor Drift - 0 6B: Test Right Leg Motor Drift - 0 7: Test Limb Ataxia - 0 8: Test Sensation - 0 9: Test Language/Aphasia- 1 10: Test Dysarthria - 0 11: Test Extinction/Inattention - 0 NIHSS score: 2   Imaging Reviewed: CT head with no acute findings  Labs reviewed in epic and pertinent values follow: CBC    Component Value Date/Time   WBC 7.5 12/14/2022 1230   RBC 4.44 12/14/2022 1230   HGB 13.2 12/14/2022 1230   HCT 39.1 12/14/2022 1230   PLT 214 12/14/2022 1230   MCV 88.1 12/14/2022 1230   MCH 29.7 12/14/2022 1230   MCHC 33.8 12/14/2022 1230   RDW 14.3 12/14/2022 1230   LYMPHSABS 0.7 12/14/2022 1230   MONOABS 0.7 12/14/2022 1230   EOSABS 0.2 12/14/2022 1230   BASOSABS 0.0 12/14/2022 1230   CMP     Component Value Date/Time   NA 132 (L) 12/14/2022 1230   K 3.7 12/14/2022 1230   CL 97 (L) 12/14/2022 1230   CO2 28 12/14/2022 1230   GLUCOSE 99 12/14/2022 1230   BUN 18 12/14/2022 1230   CREATININE 0.47 12/14/2022 1230   CALCIUM  9.2 12/14/2022 1230   PROT 6.4 (L) 12/14/2022 1230   ALBUMIN 3.8 12/14/2022 1230   AST 23 12/14/2022 1230   ALT 32 12/14/2022 1230   ALKPHOS 63 12/14/2022 1230   BILITOT 0.4 12/14/2022 1230   GFRNONAA >60 12/14/2022 1230   GFRAA >60 01/24/2015 0810     Assessment: 78 year old with history of breast cancer on treatment with Femara, presenting for evaluation of sudden onset of confusion.  Last known well 11:40 AM.  On examination has mild expressive and receptive aphasia. Attempts were made to reach out emergency contact but were unsuccessful Patient did have some comprehension and risks, benefits and alternatives were explained to the patient although I am not 100% certain if she was able to completely comprehend.  Plan was discussed in detail with the care team, who agreed that at this time, treatment with TNKase  would be beneficial since deficits of aphasia could be severely disabling for her. IV TNKase was administered at 1348 hrs. She will be admitted to [facility]  Impression: Acute ischemic stroke, likely embolic causing aphasia, status post IV thrombolysis  Recommendations:  Transfer to [facility] neuro ICU-neurology service - Dr. Lindzen has been notified at [facility] Frequent neurochecks and vitals per post NP protocol No antiplatelets or anticoagulants for next 24 hours until the 24-hour brain imaging is negative for bleed. Telemetry 2D echo A1c Lipid panel Therapy assessments N.p.o. until cleared by bedside swallow evaluation Continue attempts to reach next of kin-I called 3 times-no answer     Risks benefits  and alternatives of IV TNKase discussed, patient did verbalize understanding and agreed to proceed but I am not 100% certain that she had complete comprehension.  Risk benefits and alternatives were discussed with the care team, who agreed with my assessment that at this point, treatment with IV TNKase would be beneficial given no treatment may lead to severely disabling symptoms of residual aphasia.  CT head personally reviewed prior to TNK administration-no evidence of bleed   Discussed with EDP Dr. Elige and Jolynn Pack neurologist Dr. Merrianne   -- Eligio Lav, MD Neurologist [facility] Pager: [phone]  CRITICAL CARE ATTESTATION Performed by: Eligio Lav, MD Total critical care time: 30 minutes Critical care time was exclusive of separately billable procedures and treating other patients and/or supervising APPs/Residents/Students Critical care was necessary to treat or prevent imminent or life-threatening deterioration. This patient is critically ill and at significant risk for neurological worsening and/or death and care requires constant monitoring. Critical care was time spent personally by me on the following  activities: development of treatment plan with patient and/or surrogate as well as nursing, discussions with consultants, evaluation of patient's response to treatment, examination of patient, obtaining history from patient or surrogate, ordering and performing treatments and interventions, ordering and review of laboratory studies, ordering and review of radiographic studies, pulse oximetry, re-evaluation of patient's condition, participation in multidisciplinary rounds and medical decision making of high complexity in the care of this patient.

## 2024-06-06 NOTE — ED Provider Notes (Signed)
 [facility]MENT AT [facility] Provider Note   CSN: [id number] Arrival date & time: 06/06/24  1313     Patient presents with: Aphasia   Carrie Mayer is a 78 y.o. female.   HPI 78 year old female presents with difficulty speaking.  History is extremely limited as she is having trouble getting certain words out at times.  It seems that she drove herself then.  When asking for someone to call, she gives me the phone number of Cyd Overby, who is her coworker. Per her, the patient was seen by Ms. Overby at 11:40 AM and was normal. Normally has no issues with speaking.  Prior to Admission medications   Medication Sig Start Date End Date Taking? Authorizing Provider  aspirin EC 81 MG tablet Take 81 mg by mouth daily.   Yes [provider]  letrozole (FEMARA) 2.5 MG tablet Take 2.5 mg by mouth daily.   Yes [provider]  losartan (COZAAR) 100 MG tablet Take 100 mg by mouth daily.   Yes [provider]  rosuvastatin (CRESTOR) 5 MG tablet Take 5 mg by mouth daily. 12/29/19  Yes [provider]  UNABLE TO FIND Med Name: Mosetta Nash Supplement for calcium    Yes [provider]  UNABLE TO FIND Med Name: Mosetta Fallen softgels for heartburn   Yes [provider]  UNABLE TO FIND Med Name:Doterra TriEase Softgels for allergy relief   Yes [provider]  UNABLE TO FIND Med Name: Doterra PB Assist probiotic   Yes [provider]  UNABLE TO FIND Med Name: Doterra TerraZyme for gut health   Yes [provider]  UNABLE TO FIND Med Name: Mosetta On Guard softgels immune support   Yes [provider]  UNABLE TO FIND Med Name: Doterra Mitro2Max for energy & stamina   Yes [provider]    Allergies: Codeine    Review of Systems  Unable to perform ROS: Acuity of condition    Updated Vital Signs BP (!) 146/97   Pulse 68   Temp 98.2 F (36.8 C)   Resp 18   Wt 73  kg   SpO2 93%   BMI 29.44 kg/m   Physical Exam Vitals and nursing note reviewed.  Constitutional:      Appearance: She is well-developed.  HENT:     Head: Normocephalic and atraumatic.  Eyes:     Pupils: Pupils are equal, round, and reactive to light.  Cardiovascular:     Rate and Rhythm: Normal rate and regular rhythm.     Heart sounds: Normal heart sounds.  Pulmonary:     Effort: Pulmonary effort is normal.     Breath sounds: Normal breath sounds.  Abdominal:     Palpations: Abdomen is soft.     Tenderness: There is no abdominal tenderness.  Skin:    General: Skin is warm and dry.  Neurological:     Mental Status: She is alert.     Comments: Awake, alert, able to answer where she is, her name, the month.  However she will have trouble putting complete sentences together when asked.  She was able to name 2 out of 3 objects but struggled with the wristwatch.  She has equal strength in all 4 extremities.  No facial droop or slurred speech.     (all labs ordered are listed, but only abnormal results are displayed) Labs Reviewed  ETHANOL  PROTIME-INR  APTT  CBC  DIFFERENTIAL  COMPREHENSIVE METABOLIC PANEL  WITH GFR  RAPID URINE DRUG SCREEN, HOSP PERFORMED  I-STAT CHEM 8, ED    EKG: None  Radiology: CT ANGIO HEAD NECK W WO CM (CODE STROKE) Result Date: 06/06/2024 EXAM: CTA Head and Neck with and without Intravenous Contrast. CT Head without Contrast. CLINICAL HISTORY: Neuro deficit, acute, stroke suspected. AMS post code stroke. TECHNIQUE: Axial CTA images of the head and neck performed with and without intravenous contrast. MIP reconstructed images were created and reviewed. Axial computed tomography images of the head/brain performed without intravenous contrast. Note: Per PQRS, the description of internal carotid artery percent stenosis, including 0 percent or normal exam, is based on [place] American Symptomatic Carotid Endarterectomy Trial (NASCET) criteria. Dose  reduction technique was used including one or more of the following: automated exposure control, adjustment of mA and kV according to patient size, and/or iterative reconstruction. CONTRAST: Without and with; 75 mL iohexol  (OMNIPAQUE ) 350 MG/ML injection. COMPARISON: None provided. FINDINGS: CT HEAD: BRAIN: No acute intraparenchymal hemorrhage. No mass lesion. No CT evidence for acute territorial infarct. No midline shift or extra-axial collection. VENTRICLES: No hydrocephalus. ORBITS: The orbits are unremarkable. SINUSES AND MASTOIDS: The paranasal sinuses and mastoid air cells are clear. 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 COMMON CAROTID ARTERIES: Retropharyngeal course of the distal right common carotid artery. No significant stenosis. No dissection or occlusion. INTERNAL CAROTID ARTERIES: Tortuosity of the distal right cervical ICA. Tortuosity of the distal left cervical ICA. No stenosis by NASCET criteria. No dissection or occlusion. VERTEBRAL ARTERIES: Tortuosity of the V1 and V2 segments of both vertebral arteries. Atherosclerosis of the bilateral V4 segments without significant stenosis. No dissection or occlusion. CTA HEAD: ANTERIOR CEREBRAL ARTERIES: No significant stenosis. No occlusion. No aneurysm. MIDDLE CEREBRAL ARTERIES: No significant stenosis. No occlusion. No aneurysm. POSTERIOR CEREBRAL ARTERIES: No significant stenosis. No occlusion. No aneurysm. BASILAR ARTERY: No significant stenosis. No occlusion. No aneurysm. OTHER: Ectasia of the bilateral Cavernous ICAs greater on the right without frank aneurysmal dilatation. SOFT TISSUES: There is a 9 x 8 mm outpouching in the left submandibular space likely involving the left facial artery suggestive of aneurysm. No masses or lymphadenopathy. BONES: Degenerative changes in the visualized spine. No acute osseous abnormality. IMPRESSION: 1. No large vessel occlusion, significant stenosis, or dissection  involving the arteries of the head and neck. 2. 9 x 8 mm outpouching in the left submandibular space likely involving the left facial artery, suggestive of aneurysm. Electronically signed by: Donnice Mania MD 06/06/2024 02:52 PM EDT RP Workstation: [id number]   CT HEAD CODE STROKE WO CONTRAST Result Date: 06/06/2024 CLINICAL DATA:  Code stroke. Neuro deficit, acute, stroke suspected. Altered mental status. EXAM: CT HEAD WITHOUT CONTRAST TECHNIQUE: Contiguous axial images were obtained from the base of the skull through the vertex without intravenous contrast. RADIATION DOSE REDUCTION: This exam was performed according to the departmental dose-optimization program which includes automated exposure control, adjustment of the mA and/or kV according to patient size and/or use of iterative reconstruction technique. COMPARISON:  Head CT 12/14/2022. FINDINGS: Brain: Mild generalized cerebral atrophy. Chronic lacunar infarct within the genu of left internal capsule, unchanged. There is no acute intracranial hemorrhage. No demarcated cortical infarct. No extra-axial fluid collection. No evidence of an intracranial mass. No midline shift. Vascular: No hyperdense vessel. Skull: No calvarial fracture or aggressive osseous lesion. Sinuses/Orbits: No mass or acute finding within the imaged orbits. No significant paranasal sinus disease at the imaged levels. ASPECTS [facility] Stroke Program Early CT Score) -  Ganglionic level infarction (caudate, lentiform nuclei, internal capsule, insula, M1-M3 cortex): 7 - Supraganglionic infarction (M4-M6 cortex): 3 Total score (0-10 with 10 being normal): 10 No evidence of an acute intracranial abnormality. These results were communicated to Dr. Voncile at 1:53 pmon 9/29/2025by text page via the [facility] messaging system. IMPRESSION: 1. No evidence of an acute intracranial abnormality. 2. Chronic lacunar infarct within the genu of left internal capsule, unchanged. 3. Mild generalized cerebral  atrophy. Electronically Signed   By: Rockey Childs D.O.   On: 06/06/2024 13:53     .Critical Care  Performed by: Freddi Hamilton, MD Authorized by: Freddi Hamilton, MD   Critical care provider statement:    Critical care time (minutes):  30   Critical care time was exclusive of:  Separately billable procedures and treating other patients   Critical care was necessary to treat or prevent imminent or life-threatening deterioration of the following conditions:  CNS failure or compromise   Critical care was time spent personally by me on the following activities:  Development of treatment plan with patient or surrogate, discussions with consultants, evaluation of patient's response to treatment, examination of patient, ordering and review of laboratory studies, ordering and review of radiographic studies, ordering and performing treatments and interventions, pulse oximetry, re-evaluation of patient's condition and review of old charts    Medications Ordered in the ED  tenecteplase (TNKASE) injection for Stroke 18 mg ( Intravenous Canceled Entry 06/06/24 1349)  iohexol  (OMNIPAQUE ) 350 MG/ML injection 75 mL (75 mLs Intravenous Contrast Given 06/06/24 1358)                                    Medical Decision Making Amount and/or Complexity of Data Reviewed External Data Reviewed: notes. Labs: ordered.    Details: Normal hemoglobin Radiology: ordered.    Details: No head bleed ECG/medicine tests: ordered and independent interpretation performed.    Details: No ischemia  Risk Decision regarding hospitalization.   Patient presents with strokelike symptoms.  I was able to talk to her coworker, as above, and it seems like she is well within the code stroke window.  Was given TNK after initial negative head CT after discussion with neurology.  She will be transferred to [facility] for neuro ICU monitoring and stroke workup.  Discussed with Dr. Voncile.     Final diagnoses:  Acute ischemic  stroke [facility])    ED Discharge Orders     None          Freddi Hamilton, MD 06/06/24 1504

## 2024-06-06 NOTE — ED Notes (Signed)
 Pt taken to ct at 1333

## 2024-06-06 NOTE — Progress Notes (Signed)
 eLink Physician-Brief Progress Note Patient Name: Zurisadai Helminiak DOB: 1946/06/26 MRN: [id number]   Date of Service  06/06/2024  HPI/Events of Note  eICU Brief new admit note: 7 F with difficulty in speaking, s/p TNK at 1349 in [facility] ED, now in [facility] ICU. CTA no LVO.   Data:  From [facility]; Sodium 130 LFT , CBC normal  Possible left facial small aneurysm on CTA . ( Need OMF/Vascular opinion).  EKG: normal sinus, no acute changes.  Camera: HR 80, speaking fine, alert and awake.  Just arrived. Sats on room air 94%.   eICU Interventions  - asp nad sz precautions - neuro checks - MRI brain in AM - CBG goals < 180 ECHO - keep SBP < 180. On  cleviprex drip and prn HTN medication - labs.      Intervention Category Major Interventions: Other:;Change in mental status - evaluation and management Evaluation Type: New Patient Evaluation  Jodelle ONEIDA Hutching 06/06/2024, 7:22 PM

## 2024-06-06 NOTE — ED Notes (Signed)
 Edp spoke with a friend Mrs Jerilee from her job. She states pt left work at 1140 and was normal. Arrived here at [employer]

## 2024-06-06 NOTE — ED Notes (Signed)
Teleneuro activated.  

## 2024-06-06 NOTE — ED Triage Notes (Signed)
 Pt came to ED confused and aphasia, no family and can not tell us  what is wrong. Taken back to room and edp at bedside. Pt moving all extremities and following commands. Pupils perrla. No other deficits noted.

## 2024-06-06 NOTE — ED Notes (Signed)
 CODE STROKE activated at the time.

## 2024-06-06 NOTE — ED Notes (Signed)
[facility] called to transport patient. Nurse aware.

## 2024-06-06 NOTE — ED Notes (Signed)
 MD notified of pt's increase of BP

## 2024-06-06 NOTE — ED Notes (Signed)
 Teleneuro DR present and performing assessment.

## 2024-06-07 ENCOUNTER — Inpatient Hospital Stay (HOSPITAL_COMMUNITY)

## 2024-06-07 DIAGNOSIS — R4182 Altered mental status, unspecified: Secondary | ICD-10-CM | POA: Diagnosis not present

## 2024-06-07 DIAGNOSIS — G459 Transient cerebral ischemic attack, unspecified: Secondary | ICD-10-CM | POA: Diagnosis not present

## 2024-06-07 DIAGNOSIS — R569 Unspecified convulsions: Secondary | ICD-10-CM | POA: Diagnosis not present

## 2024-06-07 DIAGNOSIS — I6782 Cerebral ischemia: Secondary | ICD-10-CM | POA: Diagnosis not present

## 2024-06-07 DIAGNOSIS — R29818 Other symptoms and signs involving the nervous system: Secondary | ICD-10-CM

## 2024-06-07 DIAGNOSIS — G43109 Migraine with aura, not intractable, without status migrainosus: Secondary | ICD-10-CM

## 2024-06-07 DIAGNOSIS — E785 Hyperlipidemia, unspecified: Secondary | ICD-10-CM

## 2024-06-07 LAB — BASIC METABOLIC PANEL WITH GFR
Anion gap: 11 (ref 5–15)
BUN: 11 mg/dL (ref 8–23)
CO2: 22 mmol/L (ref 22–32)
Calcium: 9 mg/dL (ref 8.9–10.3)
Chloride: 100 mmol/L (ref 98–111)
Creatinine, Ser: 0.6 mg/dL (ref 0.44–1.00)
GFR, Estimated: >60 mL/min (ref >60)
Glucose, Bld: 104 mg/dL — ABNORMAL HIGH (ref 70–99)
Potassium: 3.9 mmol/L (ref 3.5–5.1)
Sodium: 133 mmol/L — ABNORMAL LOW (ref 135–145)

## 2024-06-07 LAB — CBC
HCT: 40.8 % (ref 36.0–46.0)
Hemoglobin: 13.3 g/dL (ref 12.0–15.0)
MCH: 28.8 pg (ref 26.0–34.0)
MCHC: 32.6 g/dL (ref 30.0–36.0)
MCV: 88.3 fL (ref 80.0–100.0)
Platelets: 277 K/uL (ref 150–400)
RBC: 4.62 MIL/uL (ref 3.87–5.11)
RDW: 13.5 % (ref 11.5–15.5)
WBC: 7.1 K/uL (ref 4.0–10.5)
nRBC: 0 % (ref 0.0–0.2)

## 2024-06-07 LAB — LIPID PANEL
Cholesterol: 178 mg/dL (ref 0–200)
HDL: 45 mg/dL (ref >40)
LDL Cholesterol: 105 mg/dL — ABNORMAL HIGH (ref 0–99)
Total CHOL/HDL Ratio: 4 ratio
Triglycerides: 142 mg/dL (ref <150)
VLDL: 28 mg/dL (ref 0–40)

## 2024-06-07 LAB — ECHOCARDIOGRAM COMPLETE
Area-P 1/2: 3.07 cm2
S' Lateral: 3.1 cm
Weight: 2564.39 [oz_av]

## 2024-06-07 LAB — MAGNESIUM: Magnesium: 2.1 mg/dL (ref 1.7–2.4)

## 2024-06-07 MED ORDER — LOSARTAN POTASSIUM 50 MG PO TABS: 100.0000 mg | ORAL_TABLET | Freq: Every day | ORAL | Status: DC

## 2024-06-07 MED ORDER — PANTOPRAZOLE SODIUM 40 MG PO TBEC
40.0000 mg | DELAYED_RELEASE_TABLET | Freq: Every day | ORAL | Status: DC
  Administered 2024-06-07 – 2024-06-08 (×2): 40 mg via ORAL
  Filled 2024-06-07 (×2): qty 1

## 2024-06-07 MED ORDER — ROSUVASTATIN CALCIUM 20 MG PO TABS
20.0000 mg | ORAL_TABLET | Freq: Every day | ORAL | Status: DC
  Administered 2024-06-07 – 2024-06-08 (×2): 20 mg via ORAL
  Filled 2024-06-07 (×2): qty 1

## 2024-06-07 MED ORDER — LETROZOLE 2.5 MG PO TABS
2.5000 mg | ORAL_TABLET | Freq: Every day | ORAL | Status: DC
  Administered 2024-06-07 – 2024-06-08 (×2): 2.5 mg via ORAL
  Filled 2024-06-07 (×2): qty 1

## 2024-06-07 MED ORDER — ASPIRIN 81 MG PO TBEC
81.0000 mg | DELAYED_RELEASE_TABLET | Freq: Every day | ORAL | Status: DC
  Administered 2024-06-07 – 2024-06-08 (×2): 81 mg via ORAL
  Filled 2024-06-07 (×2): qty 1

## 2024-06-07 MED ORDER — ROSUVASTATIN CALCIUM 5 MG PO TABS: 5.0000 mg | ORAL_TABLET | Freq: Every day | ORAL | Status: DC

## 2024-06-07 MED ORDER — ENOXAPARIN SODIUM 40 MG/0.4ML IJ SOSY
40.0000 mg | PREFILLED_SYRINGE | INTRAMUSCULAR | Status: DC
  Administered 2024-06-07: 40 mg via SUBCUTANEOUS
  Filled 2024-06-07: qty 0.4

## 2024-06-07 NOTE — Evaluation (Signed)
 Physical Therapy Evaluation Patient Details Name: Carrie Mayer MRN: [id number] DOB: [birth year] Today's Date: 06/07/2024  History of Present Illness  78 year old presenting 06/06/24 for evaluation of sudden onset of confusion, admitted for post-TNK monitoring and stroke workup.  NIH on Admission: 2; MRI no acute changes; EEG suggestive of L tempro-parietal dysfunction ?post-ictal state  PMH-with history of breast cancer  Clinical Impression   Pt admitted secondary to problem above with deficits below. 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  Pt currently required CGA when walking with slight deviation to her left x 2 over 200 ft without a device. Anticipate she will progress to no device needed. Currently recommend HHPT however may progress to no followup PT needs. Anticipate patient will benefit from PT to address problems listed below. Will continue to follow acutely to maximize functional mobility, independence, and safety.           If plan is discharge home, recommend the following: A little help with walking and/or transfers;Assistance with cooking/housework;Assist for transportation   Can travel by private vehicle        Equipment Recommendations None recommended by PT  Recommendations for Other Services  OT consult    Functional Status Assessment Patient has had a recent decline in their functional status and demonstrates the ability to make significant improvements in function in a reasonable and predictable amount of time.     Precautions / Restrictions Precautions Precautions: Fall Recall of Precautions/Restrictions: Intact      Mobility  Bed Mobility               General bed mobility comments: up in chair    Transfers Overall transfer level: Needs assistance Equipment used: None Transfers: Sit to/from Stand Sit to Stand: Supervision           General transfer comment: for safety     Ambulation/Gait Ambulation/Gait assistance: Contact guard assist Gait Distance (Feet): 200 Feet Assistive device: None Gait Pattern/deviations: Step-through pattern, Drifts right/left Gait velocity: able to vary up/down Gait velocity interpretation: 1.31 - 2.62 ft/sec, indicative of limited community ambulator   General Gait Details: occasional drift to her left (pt aware)  Stairs            Wheelchair Mobility     Tilt Bed    Modified Rankin (Stroke Patients Only)       Balance                                 Standardized Balance Assessment Standardized Balance Assessment : Dynamic Gait Index   Dynamic Gait Index Level Surface: Mild Impairment Change in Gait Speed: Normal Gait with Horizontal Head Turns: Mild Impairment Gait with Vertical Head Turns: Mild Impairment Gait and Pivot Turn: Normal Step Over Obstacle: Mild Impairment Step Around Obstacles: Normal Steps: Mild Impairment (clinical judgement) Total Score: 19       Pertinent Vitals/Pain Pain Assessment Pain Assessment: No/denies pain    Home Living Family/patient expects to be discharged to:: Private residence Living Arrangements: Alone Available Help at Discharge: Family;Available PRN/intermittently Type of Home: Apartment Home Access: Level entry       Home Layout: One level Home Equipment: Agricultural consultant (2 wheels);Cane - single point;Shower seat      Prior Function Prior Level of Function : Independent/Modified Independent;Driving;Working/employed               ADLs Comments: Airline pilot  Extremity/Trunk Assessment   Upper Extremity Assessment Upper Extremity Assessment: Defer to OT evaluation    Lower Extremity Assessment Lower Extremity Assessment: Overall WFL for tasks assessed    Cervical / Trunk Assessment Cervical / Trunk Assessment: Normal  Communication   Communication Communication: Impaired Factors Affecting Communication: Hearing  impaired    Cognition Arousal: Alert Behavior During Therapy: WFL for tasks assessed/performed   PT - Cognitive impairments: No apparent impairments                         Following commands: Intact       Cueing Cueing Techniques: Verbal cues     General Comments      Exercises     Assessment/Plan    PT Assessment Patient needs continued PT services  PT Problem List Decreased balance;Decreased mobility;Decreased knowledge of use of DME       PT Treatment Interventions DME instruction;Gait training;Functional mobility training;Therapeutic activities;Therapeutic exercise;Balance training;Neuromuscular re-education;Patient/family education    PT Goals (Current goals can be found in the Care Plan section)  Acute Rehab PT Goals Patient Stated Goal: walk without device PT Goal Formulation: With patient Time For Goal Achievement: 06/21/24 Potential to Achieve Goals: Good    Frequency Min 3X/week     Co-evaluation               AM-PAC PT 6 Clicks Mobility  Outcome Measure Help needed turning from your back to your side while in a flat bed without using bedrails?: None Help needed moving from lying on your back to sitting on the side of a flat bed without using bedrails?: A Little Help needed moving to and from a bed to a chair (including a wheelchair)?: A Little Help needed standing up from a chair using your arms (e.g., wheelchair or bedside chair)?: A Little Help needed to walk in hospital room?: A Little Help needed climbing 3-5 steps with a railing? : A Little 6 Click Score: 19    End of Session Equipment Utilized During Treatment: Gait belt Activity Tolerance: Patient tolerated treatment well Patient left: in chair;with call bell/phone within reach Nurse Communication: Mobility status;Other (comment) (likely will not need RW; recommend continued ambulation today) PT Visit Diagnosis: Unsteadiness on feet (R26.81)    Time: 8855-8840 PT Time  Calculation (min) (ACUTE ONLY): 15 min   Charges:   PT Evaluation $PT Eval Low Complexity: 1 Low   PT General Charges $$ ACUTE PT VISIT: 1 Visit          Macario RAMAN, PT Acute Rehabilitation Services  Office [phone]   Macario SHAUNNA Soja 06/07/2024, 12:08 PM

## 2024-06-07 NOTE — Progress Notes (Addendum)
 STROKE TEAM PROGRESS NOTE    SIGNIFICANT HOSPITAL EVENTS  9/29: Presented with confusion, vision changes, headache TNK given @ 1349  INTERIM HISTORY/SUBJECTIVE  When discussing yesterday's episode, she describes double vision and then confusion (didn't know how to use microwave). Did have a headache at this time, states that she had been having a headache for about a month. Used to have headache/high grades regularly prior to hysterectomy. States she has also had vision changers on and off for quite some time--black spots and not being able to see in certain areas of her vision.   EEG sows slowing on left, concern for post-ictal. Possible seizure as etiology of episode   PENDING 24hour CT this afternoon, likely transfer out of ICU after results.  PENDING TTE.  OBJECTIVE  CBC    Component Value Date/Time   WBC 8.4 06/06/2024 1330   RBC 4.62 06/06/2024 1330   HGB 13.5 06/06/2024 1330   HCT 39.4 06/06/2024 1330   PLT 300 06/06/2024 1330   MCV 85.3 06/06/2024 1330   MCH 29.2 06/06/2024 1330   MCHC 34.3 06/06/2024 1330   RDW 13.4 06/06/2024 1330   LYMPHSABS 2.1 06/06/2024 1330   MONOABS 0.6 06/06/2024 1330   EOSABS 0.2 06/06/2024 1330   BASOSABS 0.0 06/06/2024 1330    BMET    Component Value Date/Time   NA 130 (L) 06/06/2024 1330   K 3.8 06/06/2024 1330   CL 94 (L) 06/06/2024 1330   CO2 22 06/06/2024 1330   GLUCOSE 95 06/06/2024 1330   BUN 18 06/06/2024 1330   CREATININE 0.50 06/06/2024 1330   CALCIUM  9.2 06/06/2024 1330   GFRNONAA >60 06/06/2024 1330    IMAGING past 24 hours MR BRAIN W WO CONTRAST Result Date: 06/07/2024 CLINICAL DATA:  Follow-up examination for stroke, metastatic disease. EXAM: MRI HEAD WITHOUT AND WITH CONTRAST TECHNIQUE: Multiplanar, multiecho pulse sequences of the brain and surrounding structures were obtained without and with intravenous contrast. CONTRAST:  7mL GADAVIST GADOBUTROL 1 MMOL/ML IV SOLN COMPARISON:  CTs from earlier the same day.  FINDINGS: Brain: Generalized age-related cerebral atrophy. Patchy T2/FLAIR hyperintensity involving the periventricular deep white matter both cerebral hemispheres, consistent with chronic small vessel ischemic disease, mild for age. Remote lacunar infarct present at the left basal ganglia. No abnormal foci of restricted diffusion to suggest acute or subacute ischemia. Gray-white matter addition maintained. No areas of chronic cortical infarction. No acute or chronic intracranial blood products. No mass lesion, midline shift or mass effect. No hydrocephalus or extra-axial fluid collection. Pituitary gland and suprasellar region within normal limits. No abnormal enhancement. Vascular: Major intracranial vascular flow voids are maintained. Skull and upper cervical spine: Craniocervical junction within normal limits. Bone marrow signal intensity normal. No scalp soft tissue abnormality. Sinuses/Orbits: Prior bilateral ocular lens replacement. Paranasal sinuses are largely clear. No significant mastoid effusion. Other: None. IMPRESSION: 1. No acute intracranial abnormality. No evidence for metastatic disease. 2. Age-related cerebral atrophy with mild chronic small vessel ischemic disease, with remote lacunar infarct at the left basal ganglia. Electronically Signed   By: Morene Hoard M.D.   On: 06/07/2024 00:12   CT ANGIO HEAD NECK W WO CM (CODE STROKE) Result Date: 06/06/2024 EXAM: CTA Head and Neck with and without Intravenous Contrast. CT Head without Contrast. CLINICAL HISTORY: Neuro deficit, acute, stroke suspected. AMS post code stroke. TECHNIQUE: Axial CTA images of the head and neck performed with and without intravenous contrast. MIP reconstructed images were created and reviewed. Axial computed tomography images of the  head/brain performed without intravenous contrast. Note: Per PQRS, the description of internal carotid artery percent stenosis, including 0 percent or normal exam, is based on [place]  American Symptomatic Carotid Endarterectomy Trial (NASCET) criteria. Dose reduction technique was used including one or more of the following: automated exposure control, adjustment of mA and kV according to patient size, and/or iterative reconstruction. CONTRAST: Without and with; 75 mL iohexol  (OMNIPAQUE ) 350 MG/ML injection. COMPARISON: None provided. FINDINGS: CT HEAD: BRAIN: No acute intraparenchymal hemorrhage. No mass lesion. No CT evidence for acute territorial infarct. No midline shift or extra-axial collection. VENTRICLES: No hydrocephalus. ORBITS: The orbits are unremarkable. SINUSES AND MASTOIDS: The paranasal sinuses and mastoid air cells are clear. 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 COMMON CAROTID ARTERIES: Retropharyngeal course of the distal right common carotid artery. No significant stenosis. No dissection or occlusion. INTERNAL CAROTID ARTERIES: Tortuosity of the distal right cervical ICA. Tortuosity of the distal left cervical ICA. No stenosis by NASCET criteria. No dissection or occlusion. VERTEBRAL ARTERIES: Tortuosity of the V1 and V2 segments of both vertebral arteries. Atherosclerosis of the bilateral V4 segments without significant stenosis. No dissection or occlusion. CTA HEAD: ANTERIOR CEREBRAL ARTERIES: No significant stenosis. No occlusion. No aneurysm. MIDDLE CEREBRAL ARTERIES: No significant stenosis. No occlusion. No aneurysm. POSTERIOR CEREBRAL ARTERIES: No significant stenosis. No occlusion. No aneurysm. BASILAR ARTERY: No significant stenosis. No occlusion. No aneurysm. OTHER: Ectasia of the bilateral Cavernous ICAs greater on the right without frank aneurysmal dilatation. SOFT TISSUES: There is a 9 x 8 mm outpouching in the left submandibular space likely involving the left facial artery suggestive of aneurysm. No masses or lymphadenopathy. BONES: Degenerative changes in the visualized spine. No acute osseous abnormality.  IMPRESSION: 1. No large vessel occlusion, significant stenosis, or dissection involving the arteries of the head and neck. 2. 9 x 8 mm outpouching in the left submandibular space likely involving the left facial artery, suggestive of aneurysm. Electronically signed by: Donnice Mania MD 06/06/2024 02:52 PM EDT RP Workstation: [id number]   CT HEAD CODE STROKE WO CONTRAST Result Date: 06/06/2024 CLINICAL DATA:  Code stroke. Neuro deficit, acute, stroke suspected. Altered mental status. EXAM: CT HEAD WITHOUT CONTRAST TECHNIQUE: Contiguous axial images were obtained from the base of the skull through the vertex without intravenous contrast. RADIATION DOSE REDUCTION: This exam was performed according to the departmental dose-optimization program which includes automated exposure control, adjustment of the mA and/or kV according to patient size and/or use of iterative reconstruction technique. COMPARISON:  Head CT 12/14/2022. FINDINGS: Brain: Mild generalized cerebral atrophy. Chronic lacunar infarct within the genu of left internal capsule, unchanged. There is no acute intracranial hemorrhage. No demarcated cortical infarct. No extra-axial fluid collection. No evidence of an intracranial mass. No midline shift. Vascular: No hyperdense vessel. Skull: No calvarial fracture or aggressive osseous lesion. Sinuses/Orbits: No mass or acute finding within the imaged orbits. No significant paranasal sinus disease at the imaged levels. ASPECTS [facility] Stroke Program Early CT Score) - Ganglionic level infarction (caudate, lentiform nuclei, internal capsule, insula, M1-M3 cortex): 7 - Supraganglionic infarction (M4-M6 cortex): 3 Total score (0-10 with 10 being normal): 10 No evidence of an acute intracranial abnormality. These results were communicated to Dr. Voncile at 1:53 pmon 9/29/2025by text page via the [facility] messaging system. IMPRESSION: 1. No evidence of an acute intracranial abnormality. 2. Chronic lacunar infarct within  the genu of left internal capsule, unchanged. 3. Mild generalized cerebral atrophy. Electronically Signed   By: Rockey  Richelle D.O.   On: 06/06/2024 13:53    Vitals:   06/07/24 0500 06/07/24 0600 06/07/24 0700 06/07/24 0800  BP: (!) 115/58 118/65 128/84 135/84  Pulse: (!) 46 (!) 59 (!) 48 60  Resp: 15 (!) 21 17 (!) 23  Temp:    98 F (36.7 C)  TempSrc:    Oral  SpO2: 91% 92% 93% 92%  Weight:         PHYSICAL EXAM General:  Alert, well-nourished, well-developed patient in no acute distress Psych:  Mood and affect appropriate for situation CV: Regular rate and rhythm on monitor Respiratory:  Regular, unlabored respirations on room air GI: Abdomen soft and nontender   NEURO:  Mental Status: AA&Ox3, patient is able to give clear and coherent history Speech/Language: No dysarthria or aphasia.  Naming, repetition, fluency, and comprehension intact.  Cranial Nerves:  II: PERRL. Visual fields full.  III, IV, VI: EOMI. Eyelids elevate symmetrically.  No gaze preference V: Sensation is intact to light touch and symmetrical to face.  VII: Face is symmetrical resting and smiling VIII: hearing intact to voice. IX, X: Palate elevates symmetrically. Phonation is normal.  KP:Dynloizm shrug 5/5. XII: tongue is midline without fasciculations. Motor: 5/5 strength to all muscle groups tested.  Tone: is normal and bulk is normal Sensation- Intact to light touch bilaterally. Extinction absent to light touch to DSS.   Coordination: FTN intact bilaterally, HKS: no ataxia in BLE.No drift.  Gait- deferred  Most Recent NIH: 0     ASSESSMENT/PLAN  Ms. Damica Gravlin is a 78 year old with history of breast cancer on treatment with Femara, presenting for evaluation of sudden onset of confusion, admitted for post-TNK monitoring and stroke workup.  NIH on Admission: 2 (commands and aphasia)  Strokelike symptoms status post TNK, etiology likely complicated Migraine versus seizure with postictal,  less likely TIA Patient has history of infrequent visual disturbance with aura resolved mild headache.  This time patient did have headache and visual disturbance but then developed confusion and speech difficulty which were first time. Code Stroke CT head No evidence of an acute intracranial abnormality. Chronic lacunar infarct within the genu of left internal capsule CTA head & neck No large vessel occlusion, significant stenosis, or dissection involving the arteries of the head and neck.  MRI  with and without contrast: No acute intracranial abnormality. Remote lacunar infarct at the left basal ganglia. 24 hours CT scan no acute finding, remote left BG lacunar infarct 2D Echo EF 55 to 60% LDL 105 HgbA1c 5.9 UDS negative VTE prophylaxis - SCDs.  Okay for DVT prophylaxis if no hemorrhage on 24-hour CT aspirin 81 mg daily prior to admission, now home aspirin Therapy recommendations: Home health PT Disposition:  pending  ? Postictal state Routine EEG:  suggestive of cortical dysfunction arising from left temporo-parietal region likely secondary to underlying structural abnormality, post-ictal state. No seizures or epileptiform discharges were seen throughout the recording  No AEDs indicated at this time. However, if this occurs again, may consider AED medication.   Hypertension Home meds:  losartan 100mg  daily  Stable, on low end Long-term BP goal normotensive  Hyperlipidemia Home meds:  Crestor 5mg , resumed in hospital LDL 105, goal < 70 Increase to Crestor 20mg  Continue statin at discharge  ? Facial artery aneurysm, incidental finding CTA head and neck showed 9 x 8 mm outpouching in the left submandibular space likely involving the left facial artery, suggestive of aneurysm. Recommend outpt follow up with Dr. Lester SORENSON  Other  Stroke Risk Factors Migraines with visual aura   Hospital day # 1   Pt seen by Neuro NP/APP with MD. Note/plan to be edited by MD as needed.    Rocky JAYSON Likes, DNP, AGACNP-BC [facility] Please use AMION for contact information & EPIC for messaging.   ATTENDING NOTE: I reviewed above note and agree with the assessment and plan. Pt was seen and examined.   RN at bedside.  No family at bedside.  Patient lying bed, AO x 3, slight word finding difficulty and nonfluent language, follows simple commands, able to name repeat, otherwise neuro intact.  MRI negative.  CT at 24 hours negative.  Put back on home aspirin.  EEG showed left parietal temporal slow-wave, cannot rule out postictal state.  Patient symptoms more concerning for complicated migraine but seizure in differential.  Less likely TIA.  Continue home aspirin, increase Crestor from 5-20, PT OT recommend home health.  For detailed assessment and plan, please refer to above as I have made changes wherever appropriate.   Ary Cummins, MD PhD Stroke Neurology 06/07/2024 5:07 PM  This patient is critically ill due to strokelike symptoms status post TNK and at significant risk of neurological worsening, death form bleeding from TNK. This patient's care requires constant monitoring of vital signs, hemodynamics, respiratory and cardiac monitoring, review of multiple databases, neurological assessment, discussion with family, other specialists and medical decision making of high complexity. I spent 40 minutes of neurocritical care time in the care of this patient.    To contact Stroke Continuity provider, please refer to [email or web address]. After hours, contact General Neurology

## 2024-06-07 NOTE — Progress Notes (Signed)
 Echocardiogram 2D Echocardiogram has been performed.  Carrie Mayer RDCS 06/07/2024, 9:23 AM

## 2024-06-07 NOTE — Progress Notes (Addendum)
 EEG complete. Results pending.  ?

## 2024-06-07 NOTE — Procedures (Signed)
 Patient Name: Carrie Mayer  MRN: [id number]  Epilepsy Attending: Arlin MALVA Krebs  Referring Physician/Provider: Jerri Pfeiffer, MD  Date: 06/07/2024 Duration: 27.09 mins  Patient history: 78yo F with ams. EEG to evaluate for seizure  Level of alertness: Awake  AEDs during EEG study: None  Technical aspects: This EEG study was done with scalp electrodes positioned according to the 10-20 International system of electrode placement. Electrical activity was reviewed with band pass filter of 1-70Hz , sensitivity of 7 uV/mm, display speed of 57mm/sec with a 60Hz  notched filter applied as appropriate. EEG data were recorded continuously and digitally stored.  Video monitoring was available and reviewed as appropriate.  Description: The posterior dominant rhythm consists of 9-10 Hz activity of moderate voltage (25-35 uV) seen predominantly in posterior head regions, symmetric and reactive to eye opening and eye closing. EEG showed continuous 3 to 6 Hz theta-delta slowing in left temporo-parietal region. Physiologic photic driving was seen during photic stimulation. Hyperventilation was not performed.     ABNORMALITY - Continuous slow, left temporo-parietal region  IMPRESSION: This study is suggestive of cortical dysfunction arising from left temporo-parietal region likely secondary to underlying structural abnormality, post-ictal state. No seizures or epileptiform discharges were seen throughout the recording.  Carrie Mayer

## 2024-06-07 NOTE — Discharge Summary (Incomplete)
 Stroke Discharge Summary  Patient ID: Carrie Mayer   MRN: [id number]      DOB: December 28, 1945  Date of Admission: 06/06/2024 Date of Discharge: 06/07/2024  Attending Physician:  Stroke, Md, MD, Stroke MD Consultant(s):    {consultation:[id number]} *** Patient's PCP:  Carrie Norleen PEDLAR, MD  DISCHARGE DIAGNOSIS: *** Principal Problem:   Acute ischemic stroke [facility]) Active Problems:   Ischemic stroke (HCC)   Allergies as of 06/07/2024       Reactions   Codeine Nausea And Vomiting     Med Rec must be completed prior to using this [facility]***       LABORATORY STUDIES CBC    Component Value Date/Time   WBC 7.1 06/07/2024 0943   RBC 4.62 06/07/2024 0943   HGB 13.3 06/07/2024 0943   HCT 40.8 06/07/2024 0943   PLT 277 06/07/2024 0943   MCV 88.3 06/07/2024 0943   MCH 28.8 06/07/2024 0943   MCHC 32.6 06/07/2024 0943   RDW 13.5 06/07/2024 0943   LYMPHSABS 2.1 06/06/2024 1330   MONOABS 0.6 06/06/2024 1330   EOSABS 0.2 06/06/2024 1330   BASOSABS 0.0 06/06/2024 1330   CMP    Component Value Date/Time   NA 133 (L) 06/07/2024 0943   K 3.9 06/07/2024 0943   CL 100 06/07/2024 0943   CO2 22 06/07/2024 0943   GLUCOSE 104 (H) 06/07/2024 0943   BUN 11 06/07/2024 0943   CREATININE 0.60 06/07/2024 0943   CALCIUM  9.0 06/07/2024 0943   PROT 6.8 06/06/2024 1330   ALBUMIN 4.0 06/06/2024 1330   AST 20 06/06/2024 1330   ALT 20 06/06/2024 1330   ALKPHOS 76 06/06/2024 1330   BILITOT 0.7 06/06/2024 1330   GFRNONAA >60 06/07/2024 0943   GFRAA >60 01/24/2015 0810   COAGS Lab Results  Component Value Date   INR 0.9 06/06/2024   Lipid Panel    Component Value Date/Time   CHOL 178 06/07/2024 0529   TRIG 142 06/07/2024 0529   HDL 45 06/07/2024 0529   CHOLHDL 4.0 06/07/2024 0529   VLDL 28 06/07/2024 0529   LDLCALC 105 (H) 06/07/2024 0529   HgbA1C  Lab Results  Component Value Date   HGBA1C 5.9 (H) 06/06/2024   Urinalysis    Component Value Date/Time   COLORURINE YELLOW  08/23/2016 2132   APPEARANCEUR CLEAR 08/23/2016 2132   LABSPEC 1.019 08/23/2016 2132   PHURINE 5.0 08/23/2016 2132   GLUCOSEU NEGATIVE 08/23/2016 2132   HGBUR LARGE (A) 08/23/2016 2132   BILIRUBINUR NEGATIVE 08/23/2016 2132   KETONESUR NEGATIVE 08/23/2016 2132   PROTEINUR 30 (A) 08/23/2016 2132   UROBILINOGEN 0.2 04/02/2014 1945   NITRITE NEGATIVE 08/23/2016 2132   LEUKOCYTESUR SMALL (A) 08/23/2016 2132   Urine Drug Screen     Component Value Date/Time   LABOPIA NONE DETECTED 06/06/2024 1810   COCAINSCRNUR NONE DETECTED 06/06/2024 1810   LABBENZ NONE DETECTED 06/06/2024 1810   AMPHETMU NONE DETECTED 06/06/2024 1810   THCU NONE DETECTED 06/06/2024 1810   LABBARB NONE DETECTED 06/06/2024 1810    Alcohol Level    Component Value Date/Time   [facility] <15 06/06/2024 1330     SIGNIFICANT DIAGNOSTIC STUDIES EEG adult Result Date: 06/07/2024 Shelton Carrie KIDD, MD     06/07/2024 11:01 AM Patient Name: Carrie Mayer MRN: [id number] Epilepsy Attending: Arlin Mayer Shelton Referring Physician/Provider: Jerri Pfeiffer, MD Date: 06/07/2024 Duration: 27.09 mins Patient history: 78yo F with ams. EEG to evaluate for seizure Level of alertness: Awake AEDs during  EEG study: None Technical aspects: This EEG study was done with scalp electrodes positioned according to the 10-20 International system of electrode placement. Electrical activity was reviewed with band pass filter of 1-70Hz , sensitivity of 7 uV/mm, display speed of 67mm/sec with a 60Hz  notched filter applied as appropriate. EEG data were recorded continuously and digitally stored.  Video monitoring was available and reviewed as appropriate. Description: The posterior dominant rhythm consists of 9-10 Hz activity of moderate voltage (25-35 uV) seen predominantly in posterior head regions, symmetric and reactive to eye opening and eye closing. EEG showed continuous 3 to 6 Hz theta-delta slowing in left temporo-parietal region. Physiologic photic driving  was seen during photic stimulation. Hyperventilation was not performed.   ABNORMALITY - Continuous slow, left temporo-parietal region IMPRESSION: This study is suggestive of cortical dysfunction arising from left temporo-parietal region likely secondary to underlying structural abnormality, post-ictal state. No seizures or epileptiform discharges were seen throughout the recording. Carrie Mayer   MR BRAIN W WO CONTRAST Result Date: 06/07/2024 CLINICAL DATA:  Follow-up examination for stroke, metastatic disease. EXAM: MRI HEAD WITHOUT AND WITH CONTRAST TECHNIQUE: Multiplanar, multiecho pulse sequences of the brain and surrounding structures were obtained without and with intravenous contrast. CONTRAST:  7mL GADAVIST GADOBUTROL 1 MMOL/ML IV SOLN COMPARISON:  CTs from earlier the same day. FINDINGS: Brain: Generalized age-related cerebral atrophy. Patchy T2/FLAIR hyperintensity involving the periventricular deep white matter both cerebral hemispheres, consistent with chronic small vessel ischemic disease, mild for age. Remote lacunar infarct present at the left basal ganglia. No abnormal foci of restricted diffusion to suggest acute or subacute ischemia. Gray-white matter addition maintained. No areas of chronic cortical infarction. No acute or chronic intracranial blood products. No mass lesion, midline shift or mass effect. No hydrocephalus or extra-axial fluid collection. Pituitary gland and suprasellar region within normal limits. No abnormal enhancement. Vascular: Major intracranial vascular flow voids are maintained. Skull and upper cervical spine: Craniocervical junction within normal limits. Bone marrow signal intensity normal. No scalp soft tissue abnormality. Sinuses/Orbits: Prior bilateral ocular lens replacement. Paranasal sinuses are largely clear. No significant mastoid effusion. Other: None. IMPRESSION: 1. No acute intracranial abnormality. No evidence for metastatic disease. 2. Age-related  cerebral atrophy with mild chronic small vessel ischemic disease, with remote lacunar infarct at the left basal ganglia. Electronically Signed   By: Carrie Mayer M.D.   On: 06/07/2024 00:12   CT ANGIO HEAD NECK W WO CM (CODE STROKE) Result Date: 06/06/2024 EXAM: CTA Head and Neck with and without Intravenous Contrast. CT Head without Contrast. CLINICAL HISTORY: Neuro deficit, acute, stroke suspected. AMS post code stroke. TECHNIQUE: Axial CTA images of the head and neck performed with and without intravenous contrast. MIP reconstructed images were created and reviewed. Axial computed tomography images of the head/brain performed without intravenous contrast. Note: Per PQRS, the description of internal carotid artery percent stenosis, including 0 percent or normal exam, is based on [place] American Symptomatic Carotid Endarterectomy Trial (NASCET) criteria. Dose reduction technique was used including one or more of the following: automated exposure control, adjustment of mA and kV according to patient size, and/or iterative reconstruction. CONTRAST: Without and with; 75 mL iohexol  (OMNIPAQUE ) 350 MG/ML injection. COMPARISON: None provided. FINDINGS: CT HEAD: BRAIN: No acute intraparenchymal hemorrhage. No mass lesion. No CT evidence for acute territorial infarct. No midline shift or extra-axial collection. VENTRICLES: No hydrocephalus. ORBITS: The orbits are unremarkable. SINUSES AND MASTOIDS: The paranasal sinuses and mastoid air cells are clear. 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 COMMON CAROTID ARTERIES: Retropharyngeal course of the distal right common carotid artery. No significant stenosis. No dissection or occlusion. INTERNAL CAROTID ARTERIES: Tortuosity of the distal right cervical ICA. Tortuosity of the distal left cervical ICA. No stenosis by NASCET criteria. No dissection or occlusion. VERTEBRAL ARTERIES: Tortuosity of the V1 and V2 segments of both  vertebral arteries. Atherosclerosis of the bilateral V4 segments without significant stenosis. No dissection or occlusion. CTA HEAD: ANTERIOR CEREBRAL ARTERIES: No significant stenosis. No occlusion. No aneurysm. MIDDLE CEREBRAL ARTERIES: No significant stenosis. No occlusion. No aneurysm. POSTERIOR CEREBRAL ARTERIES: No significant stenosis. No occlusion. No aneurysm. BASILAR ARTERY: No significant stenosis. No occlusion. No aneurysm. OTHER: Ectasia of the bilateral Cavernous ICAs greater on the right without frank aneurysmal dilatation. SOFT TISSUES: There is a 9 x 8 mm outpouching in the left submandibular space likely involving the left facial artery suggestive of aneurysm. No masses or lymphadenopathy. BONES: Degenerative changes in the visualized spine. No acute osseous abnormality. IMPRESSION: 1. No large vessel occlusion, significant stenosis, or dissection involving the arteries of the head and neck. 2. 9 x 8 mm outpouching in the left submandibular space likely involving the left facial artery, suggestive of aneurysm. Electronically signed by: Donnice Mania MD 06/06/2024 02:52 PM EDT RP Workstation: [id number]   CT HEAD CODE STROKE WO CONTRAST Result Date: 06/06/2024 CLINICAL DATA:  Code stroke. Neuro deficit, acute, stroke suspected. Altered mental status. EXAM: CT HEAD WITHOUT CONTRAST TECHNIQUE: Contiguous axial images were obtained from the base of the skull through the vertex without intravenous contrast. RADIATION DOSE REDUCTION: This exam was performed according to the departmental dose-optimization program which includes automated exposure control, adjustment of the mA and/or kV according to patient size and/or use of iterative reconstruction technique. COMPARISON:  Head CT 12/14/2022. FINDINGS: Brain: Mild generalized cerebral atrophy. Chronic lacunar infarct within the genu of left internal capsule, unchanged. There is no acute intracranial hemorrhage. No demarcated cortical infarct. No  extra-axial fluid collection. No evidence of an intracranial mass. No midline shift. Vascular: No hyperdense vessel. Skull: No calvarial fracture or aggressive osseous lesion. Sinuses/Orbits: No mass or acute finding within the imaged orbits. No significant paranasal sinus disease at the imaged levels. ASPECTS [facility] Stroke Program Early CT Score) - Ganglionic level infarction (caudate, lentiform nuclei, internal capsule, insula, M1-M3 cortex): 7 - Supraganglionic infarction (M4-M6 cortex): 3 Total score (0-10 with 10 being normal): 10 No evidence of an acute intracranial abnormality. These results were communicated to Dr. Voncile at 1:53 pmon 9/29/2025by text page via the [facility] messaging system. IMPRESSION: 1. No evidence of an acute intracranial abnormality. 2. Chronic lacunar infarct within the genu of left internal capsule, unchanged. 3. Mild generalized cerebral atrophy. Electronically Signed   By: Rockey Childs D.O.   On: 06/06/2024 13:53      HISTORY OF PRESENT ILLNESS 78 year old with history of breast cancer on treatment with Femara, presenting for evaluation of sudden onset of confusion, admitted for post-TNK monitoring and stroke workup.  NIH on Admission: 2 (commands and aphasia)    HOSPITAL COURSE TIA versus Complicated Migraine versus seizure Code Stroke CT head  No evidence of an acute intracranial abnormality. Chronic lacunar infarct within the genu of left internal capsule Mild generalized cerebral atrophy. CTA head & neck  No large vessel occlusion, significant stenosis, or dissection involving the arteries of the head and neck. 9 x 8 mm outpouching in the left submandibular space likely involving the left facial artery, suggestive of aneurysm.  MRI  with and without contrast: No acute intracranial abnormality. No evidence for metastatic disease. Age-related cerebral atrophy with mild chronic small vessel ischemic disease, with remote lacunar infarct at the left basal ganglia. 2D  Echo: PENDING*** Routine EEG:  suggestive of cortical dysfunction arising from left temporo-parietal region likely secondary to underlying structural abnormality, post-ictal state. No seizures or epileptiform discharges were seen throughout the recording  No AEDs indicated at this time. However, if this occurs again, she will need to be placed on AED medication.  LDL 105 HgbA1c 5.9 VTE prophylaxis - SCDs.  Okay for DVT prophylaxis if no hemorrhage on 24-hour CT aspirin 81 mg daily prior to admission, now on No antithrombotic pending 24hour CT results.  Will restart aspirin if no hemorrhage seen on CT. Therapy recommendations: Home health PT Disposition:  pending  Hypertension Home meds:  losartan 100mg  daily, continue to hold Stable, on lower end Blood Pressure Goal: BP less than 180/105  Avoid hypotension   Hyperlipidemia Home meds:  Crestor 5mg , resumed in hospital LDL 105, goal < 70 Increase to Crestor 20mg  Continue statin at discharge   Diabetes type II, Pre-Diabetic Home meds:  none HgbA1c 5.9, goal < 7.0 CBGs SSI Recommend close follow-up with PCP    Other Stroke Risk Factors Obesity, Body mass index is 29.31 kg/m., BMI >/= 30 associated with increased stroke risk, recommend weight loss, diet and exercise as appropriate  Migraines     DISCHARGE EXAM Blood pressure (!) 154/70, pulse 70, temperature 97.7 F (36.5 C), temperature source Oral, resp. rate 12, weight 72.7 kg, SpO2 95%.  PHYSICAL EXAM General:  Alert, well-nourished, well-developed patient in no acute distress Psych:  Mood and affect appropriate for situation CV: Regular rate and rhythm on monitor Respiratory:  Regular, unlabored respirations on room air GI: Abdomen soft and nontender     NEURO:  Mental Status: AA&Ox3, patient is able to give clear and coherent history Speech/Language: No dysarthria or aphasia.  Naming, repetition, fluency, and comprehension intact.   Cranial Nerves:  II: PERRL.  Visual fields full.  III, IV, VI: EOMI. Eyelids elevate symmetrically.  No gaze preference V: Sensation is intact to light touch and symmetrical to face.  VII: Face is symmetrical resting and smiling VIII: hearing intact to voice. IX, X: Palate elevates symmetrically. Phonation is normal.  KP:Dynloizm shrug 5/5. XII: tongue is midline without fasciculations. Motor: 5/5 strength to all muscle groups tested.  Tone: is normal and bulk is normal Sensation- Intact to light touch bilaterally. Extinction absent to light touch to DSS.   Coordination: FTN intact bilaterally, HKS: no ataxia in BLE.No drift.  Gait- deferred   Most Recent NIH: 0  Discharge Diet       Diet   Diet regular Room service appropriate? Yes; Fluid consistency: Thin   liquids  DISCHARGE PLAN Disposition:  *** aspirin 81 mg daily for stroke prevention  Ongoing stroke risk factor control by Primary Care Physician at time of discharge Follow-up PCP Carrie Norleen PEDLAR, MD in 2 weeks. Follow-up in [facility] in 8 weeks, office to schedule an appointment.   *** minutes were spent preparing discharge.  ***

## 2024-06-07 NOTE — Evaluation (Signed)
 Occupational Therapy Evaluation Patient Details Name: Carrie Mayer MRN: [id number] DOB: 1946/03/01 Today's Date: 06/07/2024   History of Present Illness   78 year old presenting 06/06/24 for evaluation of sudden onset of confusion, admitted for post-TNK monitoring and stroke workup.  NIH on Admission: 2; MRI no acute changes; EEG suggestive of L tempro-parietal dysfunction ?post-ictal state  PMH-with history of breast cancer     Clinical Impressions Patient admitted for the diagnosis above.  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  Currently she expresses feeling unsteady, but did not have any LOB with in room mobility.  She is presenting close to her baseline for ADL completion.  PT is following for higher level balance and mobility in the acute setting.  No acute OT needs identified, and no post acute OT anticipated.       If plan is discharge home, recommend the following:   Assist for transportation     Functional Status Assessment   Patient has not had a recent decline in their functional status     Equipment Recommendations   None recommended by OT     Recommendations for Other Services         Precautions/Restrictions   Precautions Precautions: Fall Recall of Precautions/Restrictions: Intact Restrictions Weight Bearing Restrictions Per Provider Order: No     Mobility Bed Mobility               General bed mobility comments: up in chair    Transfers Overall transfer level: Modified independent                        Balance Overall balance assessment: No apparent balance deficits (not formally assessed)                                         ADL either performed or assessed with clinical judgement   ADL Overall ADL's : At baseline                                             Vision Patient Visual Report: No change from baseline       Perception  Perception: Within Functional Limits       Praxis Praxis: [facility]       Pertinent Vitals/Pain Pain Assessment Pain Assessment: Faces Faces Pain Scale: Hurts a little bit Pain Location: Head Pain Descriptors / Indicators: Headache Pain Intervention(s): Monitored during session     Extremity/Trunk Assessment Upper Extremity Assessment Upper Extremity Assessment: Overall WFL for tasks assessed   Lower Extremity Assessment Lower Extremity Assessment: Defer to PT evaluation   Cervical / Trunk Assessment Cervical / Trunk Assessment: Normal   Communication Communication Communication: No apparent difficulties Factors Affecting Communication: Hearing impaired   Cognition Arousal: Alert Behavior During Therapy: WFL for tasks assessed/performed Cognition: No apparent impairments                               Following commands: Intact       Cueing  General Comments   Cueing Techniques: Verbal cues  Patient still feels unsteady, but did better with OT later in the afternoon.  PT to follow for balance  and mobiltiy.   Exercises     Shoulder Instructions      Home Living Family/patient expects to be discharged to:: Private residence Living Arrangements: Alone Available Help at Discharge: Family;Available PRN/intermittently Type of Home: Apartment Home Access: Level entry     Home Layout: One level     Bathroom Shower/Tub: Chief Strategy Officer: Standard     Home Equipment: Agricultural consultant (2 wheels);Cane - single point;Shower seat          Prior Functioning/Environment Prior Level of Function : Independent/Modified Independent;Driving;Working/employed               ADLs Comments: accountant    OT Problem List: Impaired balance (sitting and/or standing)   OT Treatment/Interventions:        OT Goals(Current goals can be found in the care plan section)   Acute Rehab OT Goals Patient Stated Goal: Return home OT Goal  Formulation: With patient Time For Goal Achievement: 06/10/24 Potential to Achieve Goals: Good   OT Frequency:       Co-evaluation              AM-PAC OT 6 Clicks Daily Activity     Outcome Measure Help from another person eating meals?: None Help from another person taking care of personal grooming?: None Help from another person toileting, which includes using toliet, bedpan, or urinal?: None Help from another person bathing (including washing, rinsing, drying)?: None Help from another person to put on and taking off regular upper body clothing?: None Help from another person to put on and taking off regular lower body clothing?: None 6 Click Score: 24   End of Session Nurse Communication: Mobility status  Activity Tolerance: Patient tolerated treatment well Patient left: in chair;with call bell/phone within reach  OT Visit Diagnosis: Unsteadiness on feet (R26.81)                Time: 1330-1350 OT Time Calculation (min): 20 min Charges:  OT General Charges $OT Visit: 1 Visit OT Evaluation $OT Eval Moderate Complexity: 1 Mod  06/07/2024  RP, OTR/L  Acute Rehabilitation Services  Office:  [phone]   Carrie Mayer 06/07/2024, 2:01 PM

## 2024-06-08 ENCOUNTER — Other Ambulatory Visit: Payer: Self-pay | Admitting: Cardiology

## 2024-06-08 DIAGNOSIS — R29818 Other symptoms and signs involving the nervous system: Secondary | ICD-10-CM | POA: Diagnosis not present

## 2024-06-08 DIAGNOSIS — G459 Transient cerebral ischemic attack, unspecified: Secondary | ICD-10-CM | POA: Diagnosis not present

## 2024-06-08 DIAGNOSIS — G43109 Migraine with aura, not intractable, without status migrainosus: Secondary | ICD-10-CM | POA: Diagnosis not present

## 2024-06-08 DIAGNOSIS — R569 Unspecified convulsions: Secondary | ICD-10-CM | POA: Diagnosis not present

## 2024-06-08 DIAGNOSIS — I10 Essential (primary) hypertension: Secondary | ICD-10-CM | POA: Insufficient documentation

## 2024-06-08 DIAGNOSIS — R2990 Unspecified symptoms and signs involving the nervous system: Secondary | ICD-10-CM

## 2024-06-08 MED ORDER — ROSUVASTATIN CALCIUM 20 MG PO TABS: 20.0000 mg | ORAL_TABLET | Freq: Every day | ORAL | 0 refills | Status: AC

## 2024-06-08 MED ORDER — LOSARTAN POTASSIUM 50 MG PO TABS
100.0000 mg | ORAL_TABLET | Freq: Every day | ORAL | Status: DC
  Administered 2024-06-08: 100 mg via ORAL
  Filled 2024-06-08: qty 2

## 2024-06-08 NOTE — Evaluation (Signed)
 Speech Language Pathology Evaluation Patient Details Name: Carrie Mayer MRN: [id number] DOB: 15-Dec-1945 Today's Date: 06/08/2024 Time: 8986-8956 SLP Time Calculation (min) (ACUTE ONLY): 30 min  Problem List:  Patient Active Problem List   Diagnosis Date Noted   Essential hypertension 06/08/2024   Complicated migraine 06/07/2024   Chronic rhinitis 01/07/2024   Other specified disorders of eustachian tube, bilateral 01/07/2024   Sensorineural hearing loss, bilateral 01/07/2024   Pyogenic granuloma of skin 08/28/2016   Nausea with vomiting 04/03/2014   Past Medical History:  Past Medical History:  Diagnosis Date   Anemia    Arthritis    Cancer (HCC)    left breast   Diverticulitis    GERD (gastroesophageal reflux disease)    Hyperlipidemia    Osteopenia    Past Surgical History:  Past Surgical History:  Procedure Laterality Date   ABDOMINAL HYSTERECTOMY     CATARACT EXTRACTION W/PHACO Left 02/01/2015   Procedure: CATARACT EXTRACTION PHACO AND INTRAOCULAR LENS PLACEMENT (IOC);  Surgeon: Cherene Mania, MD;  Location: [facility];  Service: Ophthalmology;  Laterality: Left;  CDE 6.48   CATARACT EXTRACTION W/PHACO Right 03/01/2015   Procedure: CATARACT EXTRACTION PHACO AND INTRAOCULAR LENS PLACEMENT RIGHT EYE; CDE: 4.09;  Surgeon: Cherene Mania, MD;  Location: [facility];  Service: Ophthalmology;  Laterality: Right;   HEMORROIDECTOMY     MYOMECTOMY  [id number]   HPI:  78 year old presenting 06/06/24 for evaluation of sudden onset of confusion, admitted for post-TNK monitoring and stroke workup.  NIH on Admission: 2; MRI no acute changes; EEG suggestive of L tempro-parietal dysfunction ?post-ictal state  PMH-with history of breast cancer   Assessment / Plan / Recommendation Clinical Impression  Cognitive-linguistic evaluation complete. Cognition and auditory/written comprehension appear WFL. Patient does however present with dysfluencies, observed primarily during  conversation, characterized by  frequent pauses and rephrasing of information due to what she describes as word finding difficulty. Intermittent decreased thought organization noted as well. Patient is able to make her needs known effectively. No f/u in the acute care setting recommended however recommend OP  SLP services after discharge if deficits persist. 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    SLP Assessment  SLP Recommendation/Assessment: All further Speech Language Pathology needs can be addressed in the next venue of care SLP Visit Diagnosis: Aphasia (R47.01) (vs cognitively based communication deficit although other aspects of cognition appear [facility])     Assistance Recommended at Discharge  Intermittent Supervision/Assistance           SLP Evaluation Cognition  Overall Cognitive Status: Within Functional Limits for tasks assessed Orientation Level: Oriented X4       Comprehension  Auditory Comprehension Overall Auditory Comprehension: Appears within functional limits for tasks assessed Interfering Components: Hearing EffectiveTechniques: Repetition;Visual/Gestural cues (written information) Visual Recognition/Discrimination Discrimination: Not tested Reading Comprehension Reading Status: Within funtional limits    Expression Expression Primary Mode of Expression: Verbal Verbal Expression Overall Verbal Expression: Impaired Initiation: No impairment Automatic Speech: Name;Social Response Level of Generative/Spontaneous Verbalization: Conversation Repetition: No impairment Naming: Impairment Responsive: 76-100% accurate Confrontation: Impaired Convergent: 75-100% accurate Divergent: 75-100% accurate Other Naming Comments: dysfluency in conversation Verbal Errors: Aware of errors Pragmatics: No impairment   Oral / Motor  Oral Motor/Sensory Function Overall Oral Motor/Sensory Function: Within functional limits Motor Speech Overall Motor  Speech: Appears within functional limits for tasks assessed           Rea Pass MA, CCC-SLP  Tayia Stonesifer Meryl 06/08/2024, 1:10  PM

## 2024-06-08 NOTE — Progress Notes (Signed)
 D/C education given to Pt and all questions answered. No printed prescriptions to give or equipment to deliver. IV removed. Pt taken to car with all belongings by volunteer services.

## 2024-06-08 NOTE — Plan of Care (Signed)
  Problem: Education: Goal: Knowledge of General Education information will improve Description: Including pain rating scale, medication(s)/side effects and non-pharmacologic comfort measures Outcome: Progressing   Problem: Activity: Goal: Risk for activity intolerance will decrease Outcome: Progressing   Problem: Nutrition: Goal: Adequate nutrition will be maintained Outcome: Progressing   Problem: Elimination: Goal: Will not experience complications related to bowel motility Outcome: Progressing Goal: Will not experience complications related to urinary retention Outcome: Progressing   Problem: Pain Managment: Goal: General experience of comfort will improve and/or be controlled Outcome: Progressing   Problem: Safety: Goal: Ability to remain free from injury will improve Outcome: Progressing   Problem: Skin Integrity: Goal: Risk for impaired skin integrity will decrease Outcome: Progressing   Problem: Education: Goal: Knowledge of disease or condition will improve Outcome: Progressing Goal: Knowledge of secondary prevention will improve (MUST DOCUMENT ALL) Outcome: Progressing   Problem: Ischemic Stroke/TIA Tissue Perfusion: Goal: Complications of ischemic stroke/TIA will be minimized Outcome: Progressing   Problem: Coping: Goal: Will verbalize positive feelings about self Outcome: Progressing Goal: Will identify appropriate support needs Outcome: Progressing   Problem: Health Behavior/Discharge Planning: Goal: Ability to manage health-related needs will improve Outcome: Progressing

## 2024-06-08 NOTE — Progress Notes (Signed)
 Ordering 30 day monitor, per neurology, for stroke-like symptoms,  Dr. Floretta to read

## 2024-06-08 NOTE — Progress Notes (Signed)
 Cognitive-linguistic evaluation complete. Full report to follow. Patient experiencing ongoing dysfluencies warranting SLP f/u. Recommend OP f/u after d/c.   Ivan Maskell MA, CCC-SLP

## 2024-06-08 NOTE — Progress Notes (Signed)
 Physical Therapy Treatment and Discharge Patient Details Name: Carrie Mayer MRN: [id number] DOB: 1946-07-31 Today's Date: 06/08/2024   History of Present Illness 78 year old presenting 06/06/24 for evaluation of sudden onset of confusion, admitted for post-TNK monitoring and stroke workup.  NIH on Admission: 2; MRI no acute changes; EEG suggestive of L tempro-parietal dysfunction ?post-ictal state  PMH-with history of breast cancer    PT Comments  Patient independent with all mobility this date. Scored 22/24 (WNL) on Dynamic Gait Index. Reports she feels back to baseline with her mobility and agrees with no need for DME or followup PT. Patient is discharged from PT.    If plan is discharge home, recommend the following:     Can travel by private vehicle        Equipment Recommendations  None recommended by PT    Recommendations for Other Services       Precautions / Restrictions Precautions Precautions: Fall Recall of Precautions/Restrictions: Intact Restrictions Weight Bearing Restrictions Per Provider Order: No     Mobility  Bed Mobility Overal bed mobility: Independent             General bed mobility comments: including manipulating linens    Transfers Overall transfer level: Independent Equipment used: None Transfers: Sit to/from Stand Sit to Stand: Independent                Ambulation/Gait Ambulation/Gait assistance: Independent Gait Distance (Feet): 150 Feet Assistive device: None Gait Pattern/deviations: WFL(Within Functional Limits) Gait velocity: able to vary up/down     General Gait Details: no drifting; even with head turns   Stairs Stairs: Yes Stairs assistance: Modified independent (Device/Increase time) Stair Management: One rail Right, Alternating pattern, Forwards Number of Stairs: 10 General stair comments: no issues   Wheelchair Mobility     Tilt Bed    Modified Rankin (Stroke Patients Only)       Balance  Overall balance assessment: Independent                               Standardized Balance Assessment Standardized Balance Assessment : Dynamic Gait Index   Dynamic Gait Index Level Surface: Normal Change in Gait Speed: Normal Gait with Horizontal Head Turns: Normal Gait with Vertical Head Turns: Normal Gait and Pivot Turn: Normal Step Over Obstacle: Mild Impairment Step Around Obstacles: Normal Steps: Mild Impairment Total Score: 22      Communication Communication Communication: No apparent difficulties Factors Affecting Communication: Hearing impaired  Cognition Arousal: Alert Behavior During Therapy: WFL for tasks assessed/performed   PT - Cognitive impairments: No apparent impairments                         Following commands: Intact      Cueing Cueing Techniques: Verbal cues  Exercises      General Comments        Pertinent Vitals/Pain Pain Assessment Pain Assessment: No/denies pain    Home Living                          Prior Function            PT Goals (current goals can now be found in the care plan section) Acute Rehab PT Goals Patient Stated Goal: walk without device PT Goal Formulation: With patient Time For Goal Achievement: 06/21/24 Potential to Achieve Goals: Good Progress towards PT goals: Goals met/education  completed, patient discharged from PT    Frequency    Min 3X/week      PT Plan      Co-evaluation              AM-PAC PT 6 Clicks Mobility   Outcome Measure  Help needed turning from your back to your side while in a flat bed without using bedrails?: None Help needed moving from lying on your back to sitting on the side of a flat bed without using bedrails?: None Help needed moving to and from a bed to a chair (including a wheelchair)?: None Help needed standing up from a chair using your arms (e.g., wheelchair or bedside chair)?: None Help needed to walk in hospital room?:  None Help needed climbing 3-5 steps with a railing? : None 6 Click Score: 24    End of Session Equipment Utilized During Treatment: Gait belt Activity Tolerance: Patient tolerated treatment well Patient left: in chair;with call bell/phone within reach Nurse Communication: Mobility status;Other (comment) (no PT or DME needs) PT Visit Diagnosis: Unsteadiness on feet (R26.81)    PT Discharge Note  Patient is being discharged from PT services secondary to:  Goals met and no further therapy needs identified.  Please see latest Therapy Progress Note for current level of functioning and progress toward goals.  Progress and discharge plan and discussed with patient/caregiver and they  Agree   Time: 0808-0820 PT Time Calculation (min) (ACUTE ONLY): 12 min  Charges:    $Gait Training: 8-22 mins PT General Charges $$ ACUTE PT VISIT: 1 Visit                      Macario RAMAN, PT Acute Rehabilitation Services  Office [phone]    Macario SHAUNNA Soja 06/08/2024, 8:25 AM

## 2024-06-09 ENCOUNTER — Telehealth: Payer: Self-pay

## 2024-06-09 ENCOUNTER — Other Ambulatory Visit: Payer: Self-pay | Admitting: Cardiology

## 2024-06-09 DIAGNOSIS — I491 Atrial premature depolarization: Secondary | ICD-10-CM

## 2024-06-09 DIAGNOSIS — I6381 Other cerebral infarction due to occlusion or stenosis of small artery: Secondary | ICD-10-CM

## 2024-06-09 DIAGNOSIS — R4701 Aphasia: Secondary | ICD-10-CM

## 2024-06-09 DIAGNOSIS — R2990 Unspecified symptoms and signs involving the nervous system: Secondary | ICD-10-CM

## 2024-06-09 DIAGNOSIS — I493 Ventricular premature depolarization: Secondary | ICD-10-CM

## 2024-06-09 DIAGNOSIS — R410 Disorientation, unspecified: Secondary | ICD-10-CM

## 2024-06-09 NOTE — Patient Instructions (Signed)
 Visit Information  Thank you for taking time to visit with me today.   Get help right away if: You or a loved one has any symptoms of a stroke. BE FAST is an easy way to remember the main warning signs of a stroke: B - Balance. Signs are dizziness, sudden trouble walking, or loss of balance. E - Eyes. Signs are trouble seeing or a sudden change in vision. F - Face. Signs are sudden weakness or numbness of the face, or the face or eyelid drooping on one side. A - Arms. Signs are weakness or numbness in an arm. This happens suddenly and usually on one side of the body. S - Speech. Signs are sudden trouble speaking, slurred speech, or trouble understanding what people say. T - Time. Time to call emergency services. Write down what time symptoms started.   Patient verbalizes understanding of instructions and care plan provided today and agrees to view in MyChart. Active MyChart status and patient understanding of how to access instructions and care plan via MyChart confirmed with patient.     The patient has been provided with contact information for the care management team and has been advised to call with any health related questions or concerns.   Please call the care guide team at [phone] if you need to cancel or reschedule your appointment.   Please call the Suicide and Crisis Lifeline: 988 call the [place]  National Suicide Prevention Lifeline: 430-037-0390 or TTY: 548-686-3431 TTY 5793852535) to talk to a trained counselor if you are experiencing a Mental Health or Behavioral Health Crisis or need someone to talk to.  Maudry Zeidan J. Esa Raden RN, MSN [facility], [facility] RN Care Manager Direct Dial: [phone]  Fax: [phone] Website: [email or web address]

## 2024-06-09 NOTE — Transitions of Care (Post Inpatient/ED Visit) (Signed)
   06/09/2024  Name: Carrie Mayer MRN: [id number] DOB: November 21, 1945  Today's TOC FU Call Status: Today's TOC FU Call Status:: Successful TOC FU Call Completed TOC FU Call Complete Date: 06/09/24 Patient's Name and Date of Birth confirmed.  Transition Care Management Follow-up Telephone Call Date of Discharge: 06/08/24 Discharge Facility: [facility]) Type of Discharge: Inpatient Admission Primary Inpatient Discharge Diagnosis:: Complicated migraine How have you been since you were released from the hospital?: Better Any questions or concerns?: No  Items Reviewed: Did you receive and understand the discharge instructions provided?: Yes Medications obtained,verified, and reconciled?: Yes (Medications Reviewed) Any new allergies since your discharge?: No Dietary orders reviewed?: Yes Type of Diet Ordered:: Low sodium Heart Healthy Do you have support at home?: Yes People in Home [RPT]: child(ren), adult Name of Support/Comfort Primary Source: Sherri- daughter  Medications Reviewed Today: Medications Reviewed Today     Reviewed by Gaston Dase, RN (Case Manager) on 06/09/24 at 1249  Med List Status: <None>   Medication Order Taking? Sig Documenting Provider Last Dose Status Informant  aspirin EC 81 MG tablet [id number] Yes Take 81 mg by mouth daily. [provider]  Active Self, Pharmacy Records  letrozole (FEMARA) 2.5 MG tablet [id number] Yes Take 2.5 mg by mouth daily. [provider]  Active Self, Pharmacy Records  losartan (COZAAR) 100 MG tablet [id number] Yes Take 100 mg by mouth daily. [provider]  Active Self, Pharmacy Records  rosuvastatin (CRESTOR) 20 MG tablet [id number] Yes Take 1 tablet (20 mg total) by mouth daily. Judithe Rocky BROCKS, NP  Active             Home Care and Equipment/Supplies: Were Home Health Services Ordered?: NA Any new equipment or medical supplies ordered?: NA  Functional Questionnaire: Do you need assistance  with bathing/showering or dressing?: No Do you need assistance with meal preparation?: No Do you need assistance with eating?: No Do you have difficulty maintaining continence: No Do you need assistance with getting out of bed/getting out of a chair/moving?: No Do you have difficulty managing or taking your medications?: No  Follow up appointments reviewed: PCP Follow-up appointment confirmed?: No (Patient to call PCP.) MD Provider Line Number:[phone] Given: No Specialist Hospital Follow-up appointment confirmed?: Yes Date of Specialist follow-up appointment?: 08/08/24 Follow-Up Specialty Provider:: Georganna Archer Do you need transportation to your follow-up appointment?: No Do you understand care options if your condition(s) worsen?: Yes-patient verbalized understanding  SDOH Interventions Today    Flowsheet Row Most Recent Value  SDOH Interventions   Food Insecurity Interventions Intervention Not Indicated  Housing Interventions Intervention Not Indicated  Transportation Interventions Intervention Not Indicated  Utilities Interventions Intervention Not Indicated   Advised on stroke symptoms of:  Sudden numbness or weakness in the face, arm, or leg, especially on one side of the body. Sudden confusion, trouble speaking, or difficulty understanding speech. Sudden trouble seeing in one or both eyes. Sudden trouble walking, dizziness, loss of balance, or lack of coordination. Sudden severe headache with no known cause. Call 9-1-1 right away if you have any of these symptoms.  Joachim Carton J. Siearra Amberg RN, MSN [facility], [facility] RN Care Manager Direct Dial: [phone]  Fax: [phone] Website: [email or web address]

## 2024-06-17 DIAGNOSIS — I10 Essential (primary) hypertension: Secondary | ICD-10-CM | POA: Diagnosis not present

## 2024-06-17 DIAGNOSIS — E669 Obesity, unspecified: Secondary | ICD-10-CM | POA: Diagnosis not present

## 2024-06-17 DIAGNOSIS — R2990 Unspecified symptoms and signs involving the nervous system: Secondary | ICD-10-CM | POA: Diagnosis not present

## 2024-06-17 DIAGNOSIS — R7303 Prediabetes: Secondary | ICD-10-CM | POA: Diagnosis not present

## 2024-06-17 DIAGNOSIS — E782 Mixed hyperlipidemia: Secondary | ICD-10-CM | POA: Diagnosis not present

## 2024-06-17 DIAGNOSIS — I639 Cerebral infarction, unspecified: Secondary | ICD-10-CM | POA: Diagnosis not present

## 2024-06-17 DIAGNOSIS — D509 Iron deficiency anemia, unspecified: Secondary | ICD-10-CM | POA: Diagnosis not present

## 2024-06-17 DIAGNOSIS — M85852 Other specified disorders of bone density and structure, left thigh: Secondary | ICD-10-CM | POA: Diagnosis not present

## 2024-06-17 DIAGNOSIS — K219 Gastro-esophageal reflux disease without esophagitis: Secondary | ICD-10-CM | POA: Diagnosis not present

## 2024-06-17 DIAGNOSIS — R945 Abnormal results of liver function studies: Secondary | ICD-10-CM | POA: Diagnosis not present

## 2024-06-17 DIAGNOSIS — R930 Abnormal findings on diagnostic imaging of skull and head, not elsewhere classified: Secondary | ICD-10-CM | POA: Diagnosis not present

## 2024-06-17 DIAGNOSIS — I493 Ventricular premature depolarization: Secondary | ICD-10-CM | POA: Diagnosis not present

## 2024-06-18 DIAGNOSIS — I493 Ventricular premature depolarization: Secondary | ICD-10-CM | POA: Diagnosis not present

## 2024-06-18 DIAGNOSIS — R2990 Unspecified symptoms and signs involving the nervous system: Secondary | ICD-10-CM | POA: Diagnosis not present

## 2024-06-18 DIAGNOSIS — I6381 Other cerebral infarction due to occlusion or stenosis of small artery: Secondary | ICD-10-CM | POA: Diagnosis not present

## 2024-06-21 DIAGNOSIS — C50212 Malignant neoplasm of upper-inner quadrant of left female breast: Secondary | ICD-10-CM | POA: Diagnosis not present

## 2024-06-21 DIAGNOSIS — Z170 Estrogen receptor positive status [ER+]: Secondary | ICD-10-CM | POA: Diagnosis not present

## 2024-06-23 DIAGNOSIS — I493 Ventricular premature depolarization: Secondary | ICD-10-CM | POA: Diagnosis not present

## 2024-06-23 DIAGNOSIS — E782 Mixed hyperlipidemia: Secondary | ICD-10-CM | POA: Diagnosis not present

## 2024-06-23 DIAGNOSIS — Z6828 Body mass index (BMI) 28.0-28.9, adult: Secondary | ICD-10-CM | POA: Diagnosis not present

## 2024-06-23 DIAGNOSIS — R2990 Unspecified symptoms and signs involving the nervous system: Secondary | ICD-10-CM | POA: Diagnosis not present

## 2024-06-23 DIAGNOSIS — I10 Essential (primary) hypertension: Secondary | ICD-10-CM | POA: Diagnosis not present

## 2024-06-23 DIAGNOSIS — E669 Obesity, unspecified: Secondary | ICD-10-CM | POA: Diagnosis not present

## 2024-06-23 DIAGNOSIS — M25472 Effusion, left ankle: Secondary | ICD-10-CM | POA: Diagnosis not present

## 2024-06-23 DIAGNOSIS — R930 Abnormal findings on diagnostic imaging of skull and head, not elsewhere classified: Secondary | ICD-10-CM | POA: Diagnosis not present

## 2024-06-23 DIAGNOSIS — R519 Headache, unspecified: Secondary | ICD-10-CM | POA: Diagnosis not present

## 2024-06-24 ENCOUNTER — Ambulatory Visit: Admitting: Neurosurgery

## 2024-06-24 ENCOUNTER — Encounter: Payer: Self-pay | Admitting: Neurosurgery

## 2024-06-24 VITALS — BP 166/77 | HR 76 | Temp 97.8°F | Ht 63.0 in | Wt 158.6 lb

## 2024-06-24 DIAGNOSIS — I720 Aneurysm of carotid artery: Secondary | ICD-10-CM | POA: Diagnosis not present

## 2024-06-24 NOTE — Progress Notes (Signed)
 Assessment : Discussed the use of AI scribe software for clinical note transcription with the patient, who gave verbal consent to proceed.  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  She initially sought medical attention due to symptoms suggestive of a stroke, including 'fuzzy, funny' vision where letters appeared to jump around, and confusion, such as an inability to operate a microwave or understand a Charity fundraiser. 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  She drove herself to the emergency room despite difficulty navigating the parking lot and understanding the menu at a drive-through. At the hospital, she had trouble articulating her symptoms, which led to a stroke workup. The workup included a CT scan and an MRI, and she received an injection that required her to remain still for 24 hours due to the risk of bleeding. She was then transferred to another facility and admitted to the ICU.  The neurologist told her that she did not have a stroke. 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  She experiences headaches almost daily, which she describes as sometimes feeling like eye strain and tension in her neck. She has a history of a hysterectomy and cataract surgery, but no other major surgeries or heart problems.  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    Plan : I am really glad to hear that she was not found to have a stroke.  I think the likely diagnosis of migraine probably correct.  On exam this aneurysm.  I told her that this course and she was my mother  Given the fact that this was diagnosed on a CT angiogram, I do not think that that as a follow-up study  would be needed and therefore, I would like to do something noninvasive like an ultrasound. She will have this ultrasound done and then I will see her back thereafter.  She is going to bring her family member with her at the next time.   Social History   Socioeconomic History   Marital status: Single    Spouse name: Not on file   Number of children: Not on file   Years of education: Not on file   Highest education level: Not on file  Occupational History    Comment: Retired  Tobacco Use   Smoking status: Never   Smokeless tobacco: Never  Vaping Use   Vaping status: Never Used  Substance and Sexual Activity   Alcohol use: Yes    Alcohol/week: 1.0 standard drink of alcohol    Types: 1 Glasses of wine per week    Comment: Occasional   Drug use: No   Sexual activity: Not Currently  Other Topics Concern   Not on file  Social History Narrative   Not on file   Social Drivers of Health   Financial Resource Strain: Low Risk  (12/24/2022)   Received from [facility]   Overall Financial Resource Strain (CARDIA)    Difficulty of Paying Living Expenses: Not hard at all  Food Insecurity: Unknown (06/09/2024)   Hunger Vital Sign    Worried About Running Out of Food in the Last Year: Not on file    Ran Out of Food in the Last Year: Never true  Transportation Needs: No Transportation Needs (06/09/2024)   PRAPARE - Administrator, Civil Service (Medical): No    Lack of Transportation (Non-Medical): No  Physical Activity: Insufficiently Active (12/24/2022)   Received from [facility]   Exercise Vital Sign    On average, how many days per week do you engage in moderate to strenuous exercise (like a brisk walk)?: 4 days    On average, how many minutes do you engage in exercise at this level?: 30 min  Stress: No Stress Concern Present (12/24/2022)   Received from [facility] - Occupational Stress Questionnaire    Feeling of  Stress : Only a little  Social Connections: Unknown (06/06/2024)   Social Connection and Isolation Panel    Frequency of Communication with Friends and Family: More than three times a week    Frequency of Social Gatherings with Friends and Family: More than three times a week    Attends Religious Services: More than 4 times per year    Active Member of [employer] or Organizations: Yes    Attends Engineer, structural: More than 4 times per year    Marital Status: Patient declined  Intimate Partner Violence: Not At Risk (06/09/2024)   Humiliation, Afraid, Rape, and Kick questionnaire    Fear of Current or Ex-Partner: No    Emotionally Abused: No    Physically Abused: No    Sexually Abused: No    Family History  Problem Relation Age of Onset   Breast cancer Mother     Allergies  Allergen Reactions   Codeine Nausea And Vomiting    Past Medical History:  Diagnosis Date   Anemia    Arthritis    Cancer (HCC)    left breast   Diverticulitis    GERD (gastroesophageal reflux disease)    Hyperlipidemia    Osteopenia     Past Surgical History:  Procedure Laterality Date   ABDOMINAL HYSTERECTOMY     CATARACT EXTRACTION W/PHACO Left 02/01/2015   Procedure: CATARACT EXTRACTION PHACO AND INTRAOCULAR LENS PLACEMENT (IOC);  Surgeon: Cherene Mania, MD;  Location: [facility];  Service: Ophthalmology;  Laterality: Left;  CDE 6.48   CATARACT EXTRACTION W/PHACO Right 03/01/2015   Procedure: CATARACT EXTRACTION PHACO AND INTRAOCULAR LENS PLACEMENT RIGHT EYE; CDE: 4.09;  Surgeon: Cherene Mania, MD;  Location: [facility];  Service: Ophthalmology;  Laterality: Right;   HEMORROIDECTOMY     MYOMECTOMY  1976     Physical Exam     Results for orders placed or performed during the hospital encounter of 06/06/24  MR BRAIN W WO CONTRAST   Narrative   CLINICAL DATA:  Follow-up examination for stroke, metastatic disease.  EXAM: MRI HEAD WITHOUT AND WITH CONTRAST  TECHNIQUE: Multiplanar, multiecho  pulse sequences of the brain and surrounding structures were obtained without and with intravenous contrast.  CONTRAST:  7mL GADAVIST GADOBUTROL 1 MMOL/ML IV SOLN  COMPARISON:  CTs from earlier the same day.  FINDINGS: Brain: Generalized age-related cerebral atrophy. Patchy T2/FLAIR hyperintensity involving the periventricular deep white matter both cerebral hemispheres, consistent with chronic small vessel ischemic disease, mild for age. Remote lacunar infarct present at the left basal ganglia.  No abnormal foci of restricted diffusion to suggest acute or subacute ischemia. Gray-white matter addition maintained. No areas of chronic cortical infarction. No acute or chronic intracranial blood products.  No mass lesion, midline shift or mass effect. No hydrocephalus or extra-axial fluid collection. Pituitary  gland and suprasellar region within normal limits.  No abnormal enhancement.  Vascular: Major intracranial vascular flow voids are maintained.  Skull and upper cervical spine: Craniocervical junction within normal limits. Bone marrow signal intensity normal. No scalp soft tissue abnormality.  Sinuses/Orbits: Prior bilateral ocular lens replacement. Paranasal sinuses are largely clear. No significant mastoid effusion.  Other: None.  IMPRESSION: 1. No acute intracranial abnormality. No evidence for metastatic disease. 2. Age-related cerebral atrophy with mild chronic small vessel ischemic disease, with remote lacunar infarct at the left basal ganglia.   Electronically Signed   By: Morene Hoard M.D.   On: 06/07/2024 00:12   CT HEAD WO CONTRAST ( )   Narrative   EXAM: CT HEAD WITHOUT CONTRAST 06/07/2024 02:42:00 PM  TECHNIQUE: CT of the head was performed without the administration of intravenous contrast. Automated exposure control, iterative reconstruction, and/or weight based adjustment of the mA/kV was utilized to reduce the radiation dose to  as low as reasonably achievable.  COMPARISON: MRI head and CT head 06/06/2024.  CLINICAL HISTORY: Stroke, follow up; s/p TNK, eval for bleed.  FINDINGS:  BRAIN AND VENTRICLES: No acute hemorrhage. No evidence of acute infarct. No hydrocephalus. No extra-axial collection. No mass effect or midline shift. Nonspecific hypoattenuation in the periventricular and subcortical white matter, most likely representing chronic microvascular ischemic changes. Mild parenchymal volume loss. Remote lacunar infarcts in the left basal ganglia. Atherosclerosis of the carotid siphons and intracranial vertebral arteries.  ORBITS: Bilateral lens replacement. No acute abnormality.  SINUSES: No acute abnormality.  SOFT TISSUES AND SKULL: No acute soft tissue abnormality. No skull fracture.  IMPRESSION: 1. No acute intracranial abnormality. 2. Mild chronic microvascular ischemic changes and mild parenchymal volume loss. 3. Remote lacunar infarcts in the left basal ganglia.  Electronically signed by: Donnice Mania MD 06/07/2024 03:02 PM EDT RP Workstation: [id number]

## 2024-07-15 ENCOUNTER — Ambulatory Visit
Admission: RE | Admit: 2024-07-15 | Discharge: 2024-07-15 | Disposition: A | Discharge status: Home / Self Care | Source: Ambulatory Visit | Attending: Neurosurgery | Admitting: Neurosurgery

## 2024-07-15 DIAGNOSIS — I6523 Occlusion and stenosis of bilateral carotid arteries: Secondary | ICD-10-CM | POA: Diagnosis not present

## 2024-07-15 DIAGNOSIS — I720 Aneurysm of carotid artery: Secondary | ICD-10-CM

## 2024-07-18 DIAGNOSIS — M25472 Effusion, left ankle: Secondary | ICD-10-CM | POA: Diagnosis not present

## 2024-07-18 DIAGNOSIS — R930 Abnormal findings on diagnostic imaging of skull and head, not elsewhere classified: Secondary | ICD-10-CM | POA: Diagnosis not present

## 2024-07-18 DIAGNOSIS — E782 Mixed hyperlipidemia: Secondary | ICD-10-CM | POA: Diagnosis not present

## 2024-07-18 DIAGNOSIS — I493 Ventricular premature depolarization: Secondary | ICD-10-CM | POA: Diagnosis not present

## 2024-07-18 DIAGNOSIS — R2990 Unspecified symptoms and signs involving the nervous system: Secondary | ICD-10-CM | POA: Diagnosis not present

## 2024-07-18 DIAGNOSIS — I10 Essential (primary) hypertension: Secondary | ICD-10-CM | POA: Diagnosis not present

## 2024-07-18 DIAGNOSIS — E669 Obesity, unspecified: Secondary | ICD-10-CM | POA: Diagnosis not present

## 2024-07-18 DIAGNOSIS — R519 Headache, unspecified: Secondary | ICD-10-CM | POA: Diagnosis not present

## 2024-07-21 ENCOUNTER — Ambulatory Visit: Attending: Cardiology

## 2024-07-21 DIAGNOSIS — I493 Ventricular premature depolarization: Secondary | ICD-10-CM

## 2024-07-21 DIAGNOSIS — R4701 Aphasia: Secondary | ICD-10-CM

## 2024-07-21 DIAGNOSIS — R2990 Unspecified symptoms and signs involving the nervous system: Secondary | ICD-10-CM

## 2024-07-21 DIAGNOSIS — I491 Atrial premature depolarization: Secondary | ICD-10-CM

## 2024-07-21 DIAGNOSIS — I6381 Other cerebral infarction due to occlusion or stenosis of small artery: Secondary | ICD-10-CM

## 2024-07-21 DIAGNOSIS — R410 Disorientation, unspecified: Secondary | ICD-10-CM

## 2024-07-22 ENCOUNTER — Encounter: Payer: Self-pay | Admitting: Cardiology

## 2024-07-22 ENCOUNTER — Ambulatory Visit: Attending: Cardiology | Admitting: Cardiology

## 2024-07-22 VITALS — BP 108/62 | HR 72 | Ht 63.0 in | Wt 158.0 lb

## 2024-07-22 DIAGNOSIS — E782 Mixed hyperlipidemia: Secondary | ICD-10-CM | POA: Diagnosis not present

## 2024-07-22 DIAGNOSIS — I491 Atrial premature depolarization: Secondary | ICD-10-CM

## 2024-07-22 DIAGNOSIS — I10 Essential (primary) hypertension: Secondary | ICD-10-CM | POA: Diagnosis not present

## 2024-07-22 NOTE — Progress Notes (Signed)
 Cardiology Office Note  Date: 07/22/2024   ID: Carrie Mayer, DOB [birth year], MRN [id number]  PCP: Shona Norleen PEDLAR, MD  Chief Complaint:  Chief Complaint  Patient presents with   Hospitalization Follow-up   History of Present Illness: Carrie Mayer is an 78 y.o. female that I saw in consultation back in 2021.  She is referred back to the office for hospital follow-up, I reviewed interval records.  She was hospitalized in October with initial concerns for evolving stroke, treated with TNK, however with neuroimaging not showing any evidence of acute event.  It was felt that she potentially had a complex migraine versus seizure activity, less likely TIA.  She was seen by neurology and it was recommended that she wear a 30-day cardiac monitor after discharge.  30-day cardiac monitor results reviewed.  Heart rate ranged from 35 bpm up to 138 bpm with average heart rate 75 bpm.  There were no pauses or high degree heart block.  No atrial fibrillation was documented.  Occasional PVCs representing 2% total beats noted.  Frequent PACs representing 25% total beats were noted.  Brief episodes of PSVT were noted, no sustained arrhythmias.  She is here for follow-up.  States that she has been doing reasonably well.  She has a follow-up with neurology next week.  I reviewed interval note by neurosurgery as well as follow-up carotid Dopplers.  She does not report any sense of regular palpitations, no sudden dizziness or syncope.  She reports compliance with her medications.  Review of Systems: As outlined in the history of present illness.  No orthopnea or PND.  Past Medical History: Past Medical History:  Diagnosis Date   Anemia    Arthritis    Cancer (HCC)    left breast   Diverticulitis    GERD (gastroesophageal reflux disease)    Hyperlipidemia    Osteopenia    Past Surgical History: Past Surgical History:  Procedure Laterality Date   ABDOMINAL HYSTERECTOMY     CATARACT  EXTRACTION W/PHACO Left 02/01/2015   Procedure: CATARACT EXTRACTION PHACO AND INTRAOCULAR LENS PLACEMENT (IOC);  Surgeon: Cherene Mania, MD;  Location: [facility];  Service: Ophthalmology;  Laterality: Left;  CDE 6.48   CATARACT EXTRACTION W/PHACO Right 03/01/2015   Procedure: CATARACT EXTRACTION PHACO AND INTRAOCULAR LENS PLACEMENT RIGHT EYE; CDE: 4.09;  Surgeon: Cherene Mania, MD;  Location: [facility];  Service: Ophthalmology;  Laterality: Right;   HEMORROIDECTOMY     MYOMECTOMY  1976   Family History: Family History  Problem Relation Age of Onset   Breast cancer Mother    Social History:  Social History   Tobacco Use   Smoking status: Never   Smokeless tobacco: Never  Substance Use Topics   Alcohol use: Yes    Alcohol/week: 1.0 standard drink of alcohol    Types: 1 Glasses of wine per week    Comment: Occasional   Medications: Current Outpatient Medications on File Prior to Visit  Medication Sig Dispense Refill   amLODipine (NORVASC) 5 MG tablet Take 5 mg by mouth daily.     aspirin EC 81 MG tablet Take 81 mg by mouth daily.     letrozole (FEMARA) 2.5 MG tablet Take 2.5 mg by mouth daily.     losartan (COZAAR) 100 MG tablet Take 100 mg by mouth daily.     rosuvastatin (CRESTOR) 20 MG tablet Take 1 tablet (20 mg total) by mouth daily. 60 tablet 0   No current facility-administered medications on file prior to visit.  Allergies: Allergies  Allergen Reactions   Codeine Nausea And Vomiting   Physical Exam: VS:  BP 108/62 (BP Location: Right Arm, Cuff Size: Normal)   Pulse 72   Ht 5' 3 (1.6 m)   Wt 158 lb (71.7 kg)   SpO2 95%   BMI 27.99 kg/m , BMI Body mass index is 27.99 kg/m.  Wt Readings from Last 3 Encounters:  07/22/24 158 lb (71.7 kg)  06/24/24 158 lb 9.6 oz (71.9 kg)  06/06/24 160 lb 4.4 oz (72.7 kg)    General: Patient appears comfortable at rest. HEENT: Conjunctiva and lids normal. Neck: Supple, no elevated JVP or carotid bruits. Lungs: Clear to auscultation,  nonlabored breathing at rest. Cardiac: Regular rate and rhythm, no S3 or significant systolic murmur. Abdomen: Soft, bowel sounds present. Extremities: No pitting edema, distal pulses 2+. Skin: Warm and dry. Musculoskeletal: No kyphosis. Neuropsychiatric: Alert and oriented x3, affect grossly appropriate.  ECG:  An ECG dated 06/07/2024 was personally reviewed today and demonstrated:  Sinus rhythm with frequent PACs and PVCs, left anterior fascicular block, poor R wave progression.  Labwork: 06/06/2024: ALT 20; AST 20 06/07/2024: BUN 11; Creatinine, Ser 0.60; Hemoglobin 13.3; Magnesium 2.1; Platelets 277; Potassium 3.9; Sodium 133     Component Value Date/Time   CHOL 178 06/07/2024 0529   TRIG 142 06/07/2024 0529   HDL 45 06/07/2024 0529   CHOLHDL 4.0 06/07/2024 0529   VLDL 28 06/07/2024 0529   LDLCALC 105 (H) 06/07/2024 0529   Other Studies Reviewed Today:  Echocardiogram 06/07/2024:  1. Left ventricular ejection fraction, by estimation, is 55 to 60%. The  left ventricle has normal function. The left ventricle has no regional  wall motion abnormalities. There is mild concentric left ventricular  hypertrophy. Diastology is indeterminate  due to frequent PVCs.   2. Right ventricular systolic function is normal. The right ventricular  size is normal. The estimated right ventricular systolic pressure is 18.2  mmHg.   3. Left atrial size was severely dilated.   4. The mitral valve is normal in structure. Trivial mitral valve  regurgitation.   5. The aortic valve is tricuspid. Aortic valve regurgitation is mild.  Aortic valve sclerosis/calcification is present, without any evidence of  aortic stenosis.   6. The inferior vena cava is normal in size with greater than 50%  respiratory variability, suggesting right atrial pressure of 3 mmHg.   Carotid Dopplers 07/15/2024: IMPRESSION: No significant stenosis in the extracranial carotid arteries bilaterally.  Assessment and Plan:  1.   Frequent PACs as well as brief bursts of SVT documented by recent 30-day cardiac monitor.  Importantly, no atrial fibrillation was documented.  She is not clearly symptomatic in terms of progressive palpitations or functional limitation, we will likely follow conservatively at this time.  Not on AV nodal blockers at present.  LVEF normal at 55 to 60%.  2.  Primary hypertension.  Currently on Norvasc 5 mg daily and Cozaar 100 mg daily.  3.  Mixed hyperlipidemia, LDL 105 in September, Crestor increased to 20 mg daily at that time.  Goal LDL under 70.  4.  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  Asymptomatic and seen by neurosurgery with plan for likely conservative management.  No carotid artery stenosis by Dopplers.  5.  Possible complex migraine, no acute stroke by neuroimaging during recent hospitalization.  She has follow-up with neurology next week.  For now on aspirin 81 mg daily (less likely TIA) and statin therapy.  Disposition:  Follow up 6 months.  Signed, Jayson JUDITHANN Sierras, M.D., F.A.C.C.  [facility]

## 2024-07-22 NOTE — Patient Instructions (Signed)
 Medication Instructions:  Your physician recommends that you continue on your current medications as directed. Please refer to the Current Medication list given to you today.   Labwork: None today  Testing/Procedures: None today  Follow-Up: 6 months  Any Other Special Instructions Will Be Listed Below (If Applicable).  If you need a refill on your cardiac medications before your next appointment, please call your pharmacy.

## 2024-07-25 ENCOUNTER — Ambulatory Visit: Admitting: Neurology

## 2024-07-25 ENCOUNTER — Encounter: Payer: Self-pay | Admitting: Neurology

## 2024-07-25 VITALS — BP 125/78 | HR 69 | Ht 63.0 in | Wt 160.0 lb

## 2024-07-25 DIAGNOSIS — R569 Unspecified convulsions: Secondary | ICD-10-CM

## 2024-07-25 DIAGNOSIS — I729 Aneurysm of unspecified site: Secondary | ICD-10-CM

## 2024-07-25 DIAGNOSIS — H9193 Unspecified hearing loss, bilateral: Secondary | ICD-10-CM

## 2024-07-25 DIAGNOSIS — G459 Transient cerebral ischemic attack, unspecified: Secondary | ICD-10-CM

## 2024-07-25 DIAGNOSIS — R4789 Other speech disturbances: Secondary | ICD-10-CM | POA: Diagnosis not present

## 2024-07-25 DIAGNOSIS — H9190 Unspecified hearing loss, unspecified ear: Secondary | ICD-10-CM

## 2024-07-25 NOTE — Progress Notes (Signed)
 [facility]  PATIENT: Carrie Mayer DOB: [birth year]  REQUESTING CLINICIAN: Judithe Rocky BROCKS, NP HISTORY FROM: Patient/Grand daughter and chart review  REASON FOR VISIT: TIA/Seizure like events    HISTORICAL  CHIEF COMPLAINT:  Chief Complaint  Patient presents with   RM12/STROKE    Pt is here with her granddaughter. Pt states that she was treated for a stroke but is unsure if she has had one.     HISTORY OF PRESENT ILLNESS:  Discussed the use of AI scribe software for clinical note transcription with the patient, who gave verbal consent to proceed.  Carrie Mayer is an 78 year old female with past medical condition including hypertension, hyperlipidemia, breast cancer who presents with episodes of visual disturbances and word finding difficulties.  In September, she went to the hospital due to difficulty focusing her vision, describing it as 'words were jumping around.' This was followed by an inability to perform simple tasks, such as using a microwave, which led her to seek medical attention. She experienced confusion and difficulty communicating her symptoms upon arrival at the hospital. Her family member noted that she was not back to her normal self when she arrived at the hospital.  In the hospital her initial head CT did not show any acute abnormality therefore she was given TNK.  MRI brain did not show any acute stroke.  The rest of her workup including the EEG show left temporal slowing.  Patient episode was thought to be complex migraine versus focal seizure, less likely seizures.  Approximately two weeks after being discharged from the hospital, she experienced an episode of numbness down her left arm and face, initially suspecting a heart attack. She recalls the numbness possibly affecting both sides of her face but is uncertain. This episode occurred at church, and she did not experience speech difficulties during this event.  She has a history of  visual disturbances, which she experiences occasionally, particularly when reading before bed. She associates these episodes with a constant dull headache, which she attributes to her medication, Femara, taken for breast cancer. These headaches are not severe but persistent.  Approximately a week after the church episode, she experienced another visual disturbance at work, accompanied by a sensation of pressure in her head. She checked her blood pressure at home, finding it elevated at 170/110, which later reduced to 170/90. Her primary care provider subsequently adjusted her blood pressure medication, amlodipine, from 2.5 mg to 5 mg, resulting in improved blood pressure control.  She has been prescribed Nurtec for migraines, which she tried during a headache episode, but it did not alleviate her symptoms.   No history of seizures and no recent falls. Her last fall occurred approximately 16 months ago, unrelated to her current symptoms.  She has a history of hearing difficulties, for which she uses hearing aids. She recently received new hearing aids and has been undergoing adjustments. She is scheduled for a follow-up audiology appointment in December.    OTHER MEDICAL CONDITIONS: Hypertension, hyperlipidemia breast cancer   REVIEW OF SYSTEMS: Full 14 system review of systems performed and negative with exception of: As noted in the HPI   ALLERGIES: Allergies  Allergen Reactions   Codeine Nausea And Vomiting    HOME MEDICATIONS: Outpatient Medications Prior to Visit  Medication Sig Dispense Refill   amLODipine (NORVASC) 5 MG tablet Take 5 mg by mouth daily.     aspirin EC 81 MG tablet Take 81 mg by mouth daily.  letrozole (FEMARA) 2.5 MG tablet Take 2.5 mg by mouth daily.     losartan (COZAAR) 100 MG tablet Take 100 mg by mouth daily.     rosuvastatin (CRESTOR) 20 MG tablet Take 1 tablet (20 mg total) by mouth daily. 60 tablet 0   No facility-administered medications prior to  visit.    PAST MEDICAL HISTORY: Past Medical History:  Diagnosis Date   Anemia    Arthritis    Cancer (HCC)    left breast   Diverticulitis    GERD (gastroesophageal reflux disease)    Hyperlipidemia    Osteopenia     PAST SURGICAL HISTORY: Past Surgical History:  Procedure Laterality Date   ABDOMINAL HYSTERECTOMY     CATARACT EXTRACTION W/PHACO Left 02/01/2015   Procedure: CATARACT EXTRACTION PHACO AND INTRAOCULAR LENS PLACEMENT (IOC);  Surgeon: Cherene Mania, MD;  Location: [facility];  Service: Ophthalmology;  Laterality: Left;  CDE 6.48   CATARACT EXTRACTION W/PHACO Right 03/01/2015   Procedure: CATARACT EXTRACTION PHACO AND INTRAOCULAR LENS PLACEMENT RIGHT EYE; CDE: 4.09;  Surgeon: Cherene Mania, MD;  Location: [facility];  Service: Ophthalmology;  Laterality: Right;   HEMORROIDECTOMY     MYOMECTOMY  1976    FAMILY HISTORY: Family History  Problem Relation Age of Onset   Breast cancer Mother     SOCIAL HISTORY: Social History   Socioeconomic History   Marital status: Single    Spouse name: Not on file   Number of children: Not on file   Years of education: Not on file   Highest education level: Not on file  Occupational History    Comment: Retired  Tobacco Use   Smoking status: Never   Smokeless tobacco: Never  Vaping Use   Vaping status: Never Used  Substance and Sexual Activity   Alcohol use: Yes    Alcohol/week: 1.0 standard drink of alcohol    Types: 1 Glasses of wine per week    Comment: Occasional   Drug use: No   Sexual activity: Not Currently  Other Topics Concern   Not on file  Social History Narrative   Not on file   Social Drivers of Health   Financial Resource Strain: Low Risk (12/24/2022)   Received from [facility]   Overall Financial Resource Strain (CARDIA)    Difficulty of Paying Living Expenses: Not hard at all  Food Insecurity: Unknown (06/09/2024)   Hunger Vital Sign    Worried About Running Out of Food in the Last Year: Not on file     Ran Out of Food in the Last Year: Never true  Transportation Needs: No Transportation Needs (06/09/2024)   PRAPARE - Administrator, Civil Service (Medical): No    Lack of Transportation (Non-Medical): No  Physical Activity: Insufficiently Active (12/24/2022)   Received from [facility]   Exercise Vital Sign    On average, how many days per week do you engage in moderate to strenuous exercise (like a brisk walk)?: 4 days    On average, how many minutes do you engage in exercise at this level?: 30 min  Stress: No Stress Concern Present (12/24/2022)   Received from [facility] - Occupational Stress Questionnaire    Feeling of Stress : Only a little  Social Connections: Unknown (06/06/2024)   Social Connection and Isolation Panel    Frequency of Communication with Friends and Family: More than three times a week    Frequency  of Social Gatherings with Friends and Family: More than three times a week    Attends Religious Services: More than 4 times per year    Active Member of [employer] or Organizations: Yes    Attends Banker Meetings: More than 4 times per year    Marital Status: Patient declined  Intimate Partner Violence: Not At Risk (06/09/2024)   Humiliation, Afraid, Rape, and Kick questionnaire    Fear of Current or Ex-Partner: No    Emotionally Abused: No    Physically Abused: No    Sexually Abused: No    PHYSICAL EXAM  GENERAL EXAM/CONSTITUTIONAL: Vitals:  Vitals:   07/25/24 1328  BP: 125/78  Pulse: 69  SpO2: 98%  Weight: 160 lb (72.6 kg)  Height: 5' 3 (1.6 m)   Body mass index is 28.34 kg/m. Wt Readings from Last 3 Encounters:  07/25/24 160 lb (72.6 kg)  07/22/24 158 lb (71.7 kg)  06/24/24 158 lb 9.6 oz (71.9 kg)   Patient is in no distress; well developed, nourished and groomed; neck is supple, hard of hearing  MUSCULOSKELETAL: Gait, strength, tone, movements noted in Neurologic exam  below  NEUROLOGIC: MENTAL STATUS:      No data to display         awake, alert, oriented to person, place and time recent and remote memory intact normal attention and concentration language fluent, comprehension intact, naming intact fund of knowledge appropriate  CRANIAL NERVE:  2nd, 3rd, 4th, 6th - Visual fields full to confrontation, extraocular muscles intact, no nystagmus 5th - facial sensation symmetric 7th - facial strength symmetric 8th - hearing intact 9th - palate elevates symmetrically, uvula midline 11th - shoulder shrug symmetric 12th - tongue protrusion midline  MOTOR:  normal bulk and tone, full strength in the BUE, BLE  SENSORY:  normal and symmetric to light touch  COORDINATION:  finger-nose-finger, fine finger movements normal  GAIT/STATION:  normal   DIAGNOSTIC DATA (LABS, IMAGING, TESTING) - I reviewed patient records, labs, notes, testing and imaging myself where available.  Lab Results  Component Value Date   WBC 7.1 06/07/2024   HGB 13.3 06/07/2024   HCT 40.8 06/07/2024   MCV 88.3 06/07/2024   PLT 277 06/07/2024      Component Value Date/Time   NA 133 (L) 06/07/2024 0943   K 3.9 06/07/2024 0943   CL 100 06/07/2024 0943   CO2 22 06/07/2024 0943   GLUCOSE 104 (H) 06/07/2024 0943   BUN 11 06/07/2024 0943   CREATININE 0.60 06/07/2024 0943   CALCIUM  9.0 06/07/2024 0943   PROT 6.8 06/06/2024 1330   ALBUMIN 4.0 06/06/2024 1330   AST 20 06/06/2024 1330   ALT 20 06/06/2024 1330   ALKPHOS 76 06/06/2024 1330   BILITOT 0.7 06/06/2024 1330   GFRNONAA >60 06/07/2024 0943   GFRAA >60 01/24/2015 0810   Lab Results  Component Value Date   CHOL 178 06/07/2024   HDL 45 06/07/2024   LDLCALC 105 (H) 06/07/2024   TRIG 142 06/07/2024   CHOLHDL 4.0 06/07/2024   Lab Results  Component Value Date   HGBA1C 5.9 (H) 06/06/2024   No results found for: VITAMINB12 No results found for: TSH  MRI Brain 06/07/2024 1. No acute intracranial  abnormality. No evidence for metastatic disease. 2. Age-related cerebral atrophy with mild chronic small vessel ischemic disease, with remote lacunar infarct at the left basal ganglia.  CTA Head and Neck 06/06/2024 1. No large vessel occlusion, significant stenosis, or dissection involving  the arteries of the head and neck. 2. 9 x 8 mm outpouching in the left submandibular space likely involving the left facial artery, suggestive of aneurysm.   EEG 06/07/2024 - Continuous slow, left temporo-parietal region    ASSESSMENT AND PLAN  78 y.o. year old female with    Transient ischemic attack and suspected seizure disorder Recent episode of numbness in left arm and face, with no speech issues. Previous MRI showed no stroke, but EEG indicated slow brainwave activity on the left side. Differential includes small stroke and seizure disorder. Flashes of light suggest possible seizure or migraine. Seizure disorder remains a differential diagnosis. - Ordered home EEG for three consecutive days to assess brain activity during wakefulness and sleep. - If EEG shows signs of seizure, will consider starting low-dose antiseizure medication. - Advised to go to the hospital first if symptoms recur to rule out stroke.  Migraine Intermittent headaches with visual disturbances. Previous treatment with Nurtec was ineffective for current headache. NSAIDs like Aleve are recommended for headache relief. - Continue using Aleve for headache relief as needed.  Facial artery aneurysm under surveillance Aneurysm in facial artery under surveillance. Recent ultrasound performed, awaiting results. No immediate surgical intervention required. - Continue surveillance of carotid artery aneurysm. - Await results of recent ultrasound.  Hearing loss Difficulty with hearing, especially in noisy environments. Recent audiology test and hearing aid adjustments completed. - Continue follow-up with audiology as scheduled.      1. TIA (transient ischemic attack)   2. Seizure-like activity (HCC)   3. Word finding difficulty   4. Aneurysm   5. Hard of hearing   6. Bilateral hearing loss, unspecified hearing loss type      Patient Instructions  Continue current medications Will obtain a 3-day ambulatory monitor to monitor symptoms.  If any abnormalities, we will start low-dose antiseizure medication Please call if symptoms reoccur Continue to follow PCP and return if worse   Orders Placed This Encounter  Procedures   AMBULATORY EEG    No orders of the defined types were placed in this encounter.   Return if symptoms worsen or fail to improve.  I personally spent a total of 60 minutes in the care of the patient today including preparing to see the patient, getting/reviewing separately obtained history, performing a medically appropriate exam/evaluation, counseling and educating, placing orders, and documenting clinical information in the EHR.   Pastor Falling, MD 07/25/2024, 2:07 PM  [facility] [address] [place], [place] [address] [phone]

## 2024-07-25 NOTE — Patient Instructions (Addendum)
 Continue current medications Will obtain a 3-day ambulatory monitor to monitor symptoms.  If any abnormalities, we will start low-dose antiseizure medication Please call if symptoms reoccur Continue to follow PCP and return if worse

## 2024-07-26 ENCOUNTER — Ambulatory Visit: Admitting: Neurosurgery

## 2024-07-26 ENCOUNTER — Encounter: Payer: Self-pay | Admitting: Neurosurgery

## 2024-07-26 VITALS — BP 128/77 | HR 70 | Temp 97.8°F | Ht 63.0 in | Wt 155.8 lb

## 2024-07-26 DIAGNOSIS — I720 Aneurysm of carotid artery: Secondary | ICD-10-CM

## 2024-07-26 NOTE — Progress Notes (Signed)
 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  We reviewed this and I told her that I like to get an ultrasound.    The ultrasound was done and the carotid artery does not show any narrowing but there is no mention of the aneurysm.  I told her that if she was my mom, I would recommend a 53-month follow-up US  which she will do.  I do not suspect that anything will need to be done about this aneurysm

## 2024-08-05 DIAGNOSIS — R4701 Aphasia: Secondary | ICD-10-CM | POA: Diagnosis not present

## 2024-08-05 DIAGNOSIS — R2990 Unspecified symptoms and signs involving the nervous system: Secondary | ICD-10-CM | POA: Diagnosis not present

## 2024-08-05 DIAGNOSIS — I6381 Other cerebral infarction due to occlusion or stenosis of small artery: Secondary | ICD-10-CM

## 2024-08-05 DIAGNOSIS — R410 Disorientation, unspecified: Secondary | ICD-10-CM

## 2024-08-05 DIAGNOSIS — I493 Ventricular premature depolarization: Secondary | ICD-10-CM

## 2024-08-05 DIAGNOSIS — I491 Atrial premature depolarization: Secondary | ICD-10-CM

## 2024-08-08 ENCOUNTER — Ambulatory Visit: Admitting: Student in an Organized Health Care Education/Training Program

## 2024-08-16 ENCOUNTER — Ambulatory Visit: Admitting: Cardiology

## 2024-08-18 DIAGNOSIS — C50212 Malignant neoplasm of upper-inner quadrant of left female breast: Secondary | ICD-10-CM | POA: Diagnosis not present

## 2024-08-18 DIAGNOSIS — Z170 Estrogen receptor positive status [ER+]: Secondary | ICD-10-CM | POA: Diagnosis not present

## 2024-09-20 ENCOUNTER — Encounter: Payer: Self-pay | Admitting: *Deleted

## 2024-09-20 NOTE — Progress Notes (Signed)
 Carrie Mayer                                          MRN: [id number]   09/20/2024   The [facility] Quality Team Specialist reviewed this patient medical record for the purposes of chart review for care gap closure. The following were reviewed: abstraction for care gap closure-controlling blood pressure.    [facility] Quality Team

## 2025-01-13 ENCOUNTER — Other Ambulatory Visit

## 2025-01-24 ENCOUNTER — Ambulatory Visit: Admitting: Neurosurgery
